# Patient Record
Sex: Female | Born: 1960 | Race: Black or African American | Hispanic: No | Marital: Single | Smoking: Never smoker
Health system: Southern US, Community
[De-identification: ages and names within clinical notes are randomized; demographics above are authoritative.]

## PROBLEM LIST (undated history)

## (undated) DIAGNOSIS — M5416 Radiculopathy, lumbar region: Secondary | ICD-10-CM

## (undated) DIAGNOSIS — F0781 Postconcussional syndrome: Secondary | ICD-10-CM

## (undated) DIAGNOSIS — R609 Edema, unspecified: Secondary | ICD-10-CM

## (undated) DIAGNOSIS — G40909 Epilepsy, unspecified, not intractable, without status epilepticus: Secondary | ICD-10-CM

## (undated) DIAGNOSIS — G43909 Migraine, unspecified, not intractable, without status migrainosus: Secondary | ICD-10-CM

## (undated) DIAGNOSIS — R7989 Other specified abnormal findings of blood chemistry: Secondary | ICD-10-CM

## (undated) DIAGNOSIS — M549 Dorsalgia, unspecified: Secondary | ICD-10-CM

## (undated) DIAGNOSIS — R569 Unspecified convulsions: Secondary | ICD-10-CM

## (undated) DIAGNOSIS — E538 Deficiency of other specified B group vitamins: Secondary | ICD-10-CM

## (undated) DIAGNOSIS — M542 Cervicalgia: Secondary | ICD-10-CM

## (undated) DIAGNOSIS — H9319 Tinnitus, unspecified ear: Secondary | ICD-10-CM

## (undated) DIAGNOSIS — R251 Tremor, unspecified: Secondary | ICD-10-CM

## (undated) DIAGNOSIS — G8929 Other chronic pain: Secondary | ICD-10-CM

## (undated) DIAGNOSIS — I10 Essential (primary) hypertension: Secondary | ICD-10-CM

## (undated) DIAGNOSIS — M79606 Pain in leg, unspecified: Secondary | ICD-10-CM

## (undated) HISTORY — DX: Migraine, unspecified, not intractable, without status migrainosus: G43.909

## (undated) HISTORY — DX: Essential (primary) hypertension: I10

## (undated) HISTORY — DX: Radiculopathy, lumbar region: M54.16

## (undated) HISTORY — PX: SHOULDER SURGERY: SHX246

## (undated) HISTORY — PX: NECK SURGERY: SHX720

## (undated) HISTORY — PX: CARPAL TUNNEL RELEASE: SHX101

## (undated) HISTORY — DX: Epilepsy, unspecified, not intractable, without status epilepticus: G40.909

## (undated) HISTORY — DX: Cervicalgia: M54.2

## (undated) HISTORY — DX: Tremor, unspecified: R25.1

## (undated) HISTORY — PX: BACK SURGERY: SHX140

## (undated) HISTORY — PX: LAPAROSCOPIC HYSTERECTOMY: SHX1926

## (undated) HISTORY — DX: Dorsalgia, unspecified: M54.9

## (undated) HISTORY — DX: Deficiency of other specified B group vitamins: E53.8

## (undated) HISTORY — PX: OTHER SURGICAL HISTORY: SHX169

## (undated) HISTORY — DX: Other chronic pain: G89.29

## (undated) HISTORY — DX: Postconcussional syndrome: F07.81

## (undated) HISTORY — DX: Unspecified convulsions: R56.9

## (undated) HISTORY — DX: Edema, unspecified: R60.9

## (undated) HISTORY — DX: Pain in leg, unspecified: M79.606

## (undated) HISTORY — PX: CHOLECYSTECTOMY: SHX55

## (undated) HISTORY — DX: Other specified abnormal findings of blood chemistry: R79.89

## (undated) HISTORY — DX: Tinnitus, unspecified ear: H93.19

---

## 2019-08-28 ENCOUNTER — Encounter: Payer: Self-pay | Admitting: Internal Medicine

## 2019-10-29 ENCOUNTER — Telehealth: Payer: Self-pay | Admitting: *Deleted

## 2019-10-29 ENCOUNTER — Encounter: Payer: Self-pay | Admitting: Nurse Practitioner

## 2019-10-29 ENCOUNTER — Ambulatory Visit (INDEPENDENT_AMBULATORY_CARE_PROVIDER_SITE_OTHER): Payer: Medicare PPO | Admitting: Nurse Practitioner

## 2019-10-29 ENCOUNTER — Other Ambulatory Visit: Payer: Self-pay

## 2019-10-29 ENCOUNTER — Telehealth: Payer: Self-pay | Admitting: Internal Medicine

## 2019-10-29 DIAGNOSIS — R1310 Dysphagia, unspecified: Secondary | ICD-10-CM | POA: Diagnosis not present

## 2019-10-29 DIAGNOSIS — K219 Gastro-esophageal reflux disease without esophagitis: Secondary | ICD-10-CM

## 2019-10-29 DIAGNOSIS — R109 Unspecified abdominal pain: Secondary | ICD-10-CM

## 2019-10-29 DIAGNOSIS — R197 Diarrhea, unspecified: Secondary | ICD-10-CM | POA: Insufficient documentation

## 2019-10-29 DIAGNOSIS — R1319 Other dysphagia: Secondary | ICD-10-CM

## 2019-10-29 DIAGNOSIS — Z800 Family history of malignant neoplasm of digestive organs: Secondary | ICD-10-CM | POA: Insufficient documentation

## 2019-10-29 MED ORDER — PANTOPRAZOLE SODIUM 40 MG PO TBEC: 40.0000 mg | DELAYED_RELEASE_TABLET | Freq: Two times a day (BID) | ORAL | 3 refills | Status: DC

## 2019-10-29 MED ORDER — DICYCLOMINE HCL 10 MG PO CAPS: 10.0000 mg | ORAL_CAPSULE | Freq: Three times a day (TID) | ORAL | 3 refills | Status: DC | PRN

## 2019-10-29 NOTE — Patient Instructions (Signed)
Your health issues we discussed today were:   GERD (reflux/heartburn): 1. Because you are having worsening GERD symptoms, I feel this can be contributing to your nausea and vomiting 2. I am going to increase your Protonix to 40 mg twice daily 3. Take this pursing in the morning and 30 minutes for your last meal the day 4. Let us know if you have any worsening or severe symptoms  Diarrhea with abdominal pain: 1. I doubt you have an infection because of the amount of time you have had your diarrhea 2. Your symptoms are consistent with irritable bowel syndrome diarrhea type 3. I will send in Bentyl (dicyclomine) 10 mg.  Take this 3 times a day, as needed/if needed for abdominal pain and/or diarrhea 4. Call us if you have any worsening or severe symptoms  Dysphagia (swallowing difficulties): 1. As we discussed, GERD symptoms over a prolonged period of time can cause irritation of your esophagus, esophageal strictures or narrowings, and swallowing difficulties 2. We will plan for an upper endoscopy with possible dilation to further evaluate and treat your symptoms 3. Try to eat softer foods for now.  If food gets stuck and will not go forward or come back for more than 2 hours then proceed to the emergency room  Family history of colon cancer: 1. Because of your family history of colon cancer and that has been 5 to 6 years since your last colonoscopy, we will schedule you for an updated colonoscopy 2. We will also request your previous colonoscopy records from [place] 3. Further recommendations will follow your colonoscopy  Overall I recommend:  1. Continue your other current medications 2. Return for follow-up in 3 months 3. Call us if you have any questions or concerns  At [facility] Gastroenterology we value your feedback. You may receive a survey about your visit today. Please share your experience as we strive to create trusting relationships with our patients to provide genuine,  compassionate, quality care.  We appreciate your understanding and patience as we review any laboratory studies, imaging, and other diagnostic tests that are ordered as we care for you. Our office policy is 5 business days for review of these results, and any emergent or urgent results are addressed in a timely manner for your best interest. If you do not hear from our office in 1 week, please contact us.   We also encourage the use of MyChart, which contains your medical information for your review as well. If you are not enrolled in this feature, an access code is on this after visit summary for your convenience. Thank you for allowing [place] to be involved in your care.  It was great to see you today!  I hope you have a great rest of your summer!!

## 2019-10-29 NOTE — Progress Notes (Signed)
Primary Care Physician:  Lamont Snowball, MD Primary Gastroenterologist:  Dr. Abbey Chatters  Chief Complaint  Patient presents with  . Diarrhea    10 years  . Emesis    10 years     HPI:   Pam Mcneil is a 59 y.o. female who presents on referral for diarrhea and vomiting.  Reviewed information provided with referral including office visit dated 07/29/2019.  At that time patient noted continued intermittent diarrhea and nausea, history of polyps with prior colonoscopy.  Family history of colon cancer.  Noted left and right lower abdominal pain that is intermittent, she was trialed on Cipro.  Today she states she's doing ok overall. She has had diarrhea for about 10 years or more. Thinks it's due to her medications side effects. Typically has a bowel movement every other day to every 2 days; soft/loose (Bristol 5-6). Has never been normal. She has had a cholecystectomy over 7 years ago. She does have abdominal pain associated with loose stools, pain improves some with bowel movement. Feels better now on "the medication" (presume Cipro). Denies hematochezia. This past weekend she had abdominal swelling and bloating with right sided pain, was taking Tylenol (near maximum dose due to not having pain medications). Uses Naloxone but the medication has been on backorder. When she stopped taking the Tylenol and rested, her symptoms improved. Having significant back pain (chronic pain). Has upcoming spine studies. She also has nausea postprandial; has solid food dysphagia and regurgitation; occurs frequently. Has GERD symptoms even with Pantoprazole 40 mg daily. Denies fever, chills, unintentional weight loss. Denies URI or flu-like symptoms. Denies loss of sense of taste or smell. The patient has not received COVID-19 vaccination(s). They are not interested in vaccine scheduling information. Denies chest pain, dyspnea, dizziness, lightheadedness, syncope, near syncope. Denies any other upper or lower GI  symptoms.  Colon cancer in her brother and was previously having annual colonoscopy, then biannual, then every 5 years. She was due for updated TCS last year and would like to pursue scheduling.  Past Medical History:  Diagnosis Date  . B12 deficiency   . Back pain   . Chronic pain   . Edema   . Epilepsy ([place])   . Hypertension   . Leg pain   . Low vitamin D level   . Lumbar radiculopathy   . Migraine   . Neck pain   . Post concussion syndrome   . Seizures ([place])    Last seizues 04/2019; sees neurology  . Tinnitus   . Tremor     Past Surgical History:  Procedure Laterality Date  . BACK SURGERY    . CARPAL TUNNEL RELEASE    . CHOLECYSTECTOMY    . LAPAROSCOPIC HYSTERECTOMY    . NECK SURGERY    . rib removal    . SHOULDER SURGERY     open    Current Outpatient Medications  Medication Sig Dispense Refill  . Ascorbic Acid (VITAMIN C) 100 MG tablet Take 500 mg by mouth daily.    Marland Kitchen aspirin 81 MG EC tablet Take by mouth daily.    . Botulinum Toxin Type A (BOTOX) 200 units SOLR 200 Units. Every 12 weeks    . celecoxib (CELEBREX) 200 MG capsule 200 mg daily.    . Cholecalciferol (DIALYVITE VITAMIN D3 MAX) 1.25 MG (50000 UT) TABS Take by mouth once a week.    . ciprofloxacin (CIPRO) 500 MG tablet 500 mg 2 (two) times daily.    . cyclobenzaprine (  FLEXERIL) 10 MG tablet 10 mg 2 (two) times daily.    Marland Kitchen estradiol (ESTRACE) 0.5 MG tablet 0.5 mg daily.    . Evening Primrose Oil 1000 MG CAPS Take 1,000 mg by mouth 3 (three) times daily.    . furosemide (LASIX) 20 MG tablet 20 mg daily.    Marland Kitchen L-Lysine 1000 MG TABS Take 1,000 mg by mouth daily.    Marland Kitchen levETIRAcetam (KEPPRA) 500 MG tablet 500 mg 2 (two) times daily.    Marland Kitchen lisinopril (ZESTRIL) 20 MG tablet 20 mg daily.    Marland Kitchen loratadine (CLARITIN) 10 MG tablet 10 mg daily.    . Multiple Vitamins-Minerals (WOMENS PACK) MISC daily.    . nortriptyline (PAMELOR) 50 MG capsule 100 mg at bedtime.    . Omega-3 Fatty Acids (FISH OIL) 1000 MG CAPS Take  by mouth 3 (three) times daily.    . ondansetron (ZOFRAN) 4 MG tablet 4 mg every 8 (eight) hours as needed.    . pantoprazole (PROTONIX) 40 MG tablet 40 mg daily.    Marland Kitchen PARoxetine (PAXIL) 40 MG tablet 40 mg daily.    . pentazocine-naloxone (TALWIN NX) 50-0.5 MG tablet Take 1 tablet by mouth every 8 (eight) hours as needed.    . pregabalin (LYRICA) 100 MG capsule 100 mg 2 (two) times daily.    . Probiotic Product (PROBIOTIC PO) Take by mouth daily.    Marland Kitchen topiramate (TOPAMAX) 50 MG tablet 50 mg 2 (two) times daily.    . valACYclovir (VALTREX) 1000 MG tablet as needed.    . dicyclomine (BENTYL) 10 MG capsule Take 1 capsule (10 mg total) by mouth 3 (three) times daily as needed for spasms (or diarrhea). 30 capsule 3  . pantoprazole (PROTONIX) 40 MG tablet Take 1 tablet (40 mg total) by mouth 2 (two) times daily before a meal. 60 tablet 3   No current facility-administered medications for this visit.    Allergies as of 10/29/2019  . (No Known Allergies)    History reviewed. No pertinent family history.  Social History   Socioeconomic History  . Marital status: Single    Spouse name: Not on file  . Number of children: Not on file  . Years of education: Not on file  . Highest education level: Not on file  Occupational History  . Not on file  Tobacco Use  . Smoking status: Never Smoker  . Smokeless tobacco: Never Used  Substance and Sexual Activity  . Alcohol use: Not on file  . Drug use: Not on file  . Sexual activity: Not on file  Other Topics Concern  . Not on file  Social History Narrative  . Not on file   Social Determinants of Health   Financial Resource Strain:   . Difficulty of Paying Living Expenses: Not on file  Food Insecurity:   . Worried About Charity fundraiser in the Last Year: Not on file  . Ran Out of Food in the Last Year: Not on file  Transportation Needs:   . Lack of Transportation (Medical): Not on file  . Lack of Transportation (Non-Medical): Not on  file  Physical Activity:   . Days of Exercise per Week: Not on file  . Minutes of Exercise per Session: Not on file  Stress:   . Feeling of Stress : Not on file  Social Connections:   . Frequency of Communication with Friends and Family: Not on file  . Frequency of Social Gatherings with Friends and Family: Not  on file  . Attends Religious Services: Not on file  . Active Member of Clubs or Organizations: Not on file  . Attends Archivist Meetings: Not on file  . Marital Status: Not on file  Intimate Partner Violence:   . Fear of Current or Ex-Partner: Not on file  . Emotionally Abused: Not on file  . Physically Abused: Not on file  . Sexually Abused: Not on file    Subjective: Review of Systems  Constitutional: Negative for chills, fever, malaise/fatigue and weight loss.  HENT: Negative for congestion and sore throat.   Respiratory: Negative for cough and shortness of breath.   Cardiovascular: Negative for chest pain and palpitations.  Gastrointestinal: Positive for abdominal pain, diarrhea, nausea and vomiting. Negative for blood in stool and melena.  Musculoskeletal: Negative for joint pain and myalgias.  Skin: Negative for rash.  Neurological: Negative for dizziness and weakness.  Endo/Heme/Allergies: Does not bruise/bleed easily.  Psychiatric/Behavioral: Negative for depression. The patient is not nervous/anxious.   All other systems reviewed and are negative.      Objective: BP 108/67   Pulse 91   Temp 97.9 F (36.6 C) (Temporal)   Ht 6' (1.829 m)   Wt 277 lb (125.6 kg)   BMI 37.57 kg/m  Physical Exam Vitals and nursing note reviewed.  Constitutional:      General: She is not in acute distress.    Appearance: Normal appearance. She is well-developed. She is obese. She is not ill-appearing, toxic-appearing or diaphoretic.  HENT:     Head: Normocephalic and atraumatic.     Nose: No congestion or rhinorrhea.  Eyes:     General: No scleral  icterus. Cardiovascular:     Rate and Rhythm: Normal rate and regular rhythm.     Heart sounds: Normal heart sounds.  Pulmonary:     Effort: Pulmonary effort is normal. No respiratory distress.     Breath sounds: Normal breath sounds.  Abdominal:     General: Bowel sounds are normal.     Palpations: Abdomen is soft. There is no hepatomegaly, splenomegaly or mass.     Tenderness: There is no abdominal tenderness. There is no guarding or rebound.     Hernia: No hernia is present.  Skin:    General: Skin is warm and dry.     Coloration: Skin is not jaundiced.     Findings: No rash.  Neurological:     General: No focal deficit present.     Mental Status: She is alert and oriented to person, place, and time.  Psychiatric:        Attention and Perception: Attention normal.        Mood and Affect: Mood normal.        Speech: Speech normal.        Behavior: Behavior normal.        Thought Content: Thought content normal.        Cognition and Memory: Cognition and memory normal.      Assessment:  Very pleasant 59 year old female who presents for evaluation of nausea and vomiting as well as diarrhea.  She states she has had the symptoms for over 10 years.  She feels like her diarrhea is due to medication effect.  She describes abdominal pain associated with diarrhea which often improves with a bowel movement.  She is not having frequent daily stools but rather has a stool about every 2 to 3 days, which is loose or soft consistently Bristol 5-6.  She has not tried any medications for this.  She also describes GERD and dysphagia symptoms.  GERD with nausea and vomiting: The patient does have persistent GERD despite Protonix once daily.  This could be contributing to her nausea and vomiting as well as her dysphagia symptoms.  At this point she is on Protonix once daily and I will increase this to twice daily.  We can consider change of agent pending EGD results.  Other possible explanations for  her nausea could be gastroparesis, gastritis, although she does not specifically have a history of diabetes.  Further recommendations to follow-up  Dysphagia: The patient notes solid food dysphagia as well as issues with liquids.  She does have longstanding, persistently symptomatic GERD which could be contributing.  Cannot rule out stricture, web, ring.  At this point given her persistent GERD symptoms and dysphagia symptoms we will plan for an upper endoscopy with possible dilation.  We discussed this and she is agreeable to proceed.  Further recommendations will follow the results of her procedure  Chronic diarrhea and family history of colorectal cancer in her brother: Her last colonoscopy was in [place] and we will request these records.  She states that her brother had colon cancer and she had undergo yearly colonoscopies for a few years at which point they back off to every other year for a few times and then every 5 years.  Her last colonoscopy has not been 5 to 6 years ago and she has since moved from [place] back down to [place].  She would like to proceed with scheduling a colonoscopy at this time as well.  We will proceed with scheduling and further recommendations will follow  Proceed with TCS and EGD +/- dilation with Dr. Abbey Chatters on propofol/MAC in near future: the risks, benefits, and alternatives have been discussed with the patient in detail. The patient states understanding and desires to proceed.  ASA III (BMI, history of seizures)   Plan: 1. Increase Protonix to 40 mg twice daily 2. Start Bentyl 3 times daily as needed for diarrhea and/or abdominal cramping 3. Request previous colonoscopy records from [facility] and [place], [place] (Dr. Norvel Richards; [phone]) 4. Proceed with colonoscopy and upper endoscopy as outlined above 5. Return for follow-up in 3 months 6. Call for worsening symptoms    Thank you for allowing [place] to participate in the care of  Pam Mcneil, [place], AGNP-C Adult & Gerontological Nurse Practitioner [facility]   10/29/2019 4:22 PM   Disclaimer: This note was dictated with voice recognition software. Similar sounding words can inadvertently be transcribed and may not be corrected upon review.

## 2019-10-29 NOTE — Telephone Encounter (Signed)
LMOVM to schedule TCS/EGD/DIL with Dr. Carver, ASA 3 

## 2019-10-29 NOTE — Telephone Encounter (Signed)
noted 

## 2019-10-29 NOTE — Telephone Encounter (Signed)
Returning call. [phone]

## 2019-10-30 NOTE — Telephone Encounter (Signed)
LMOVM. Letter mailed  

## 2019-10-30 NOTE — Telephone Encounter (Addendum)
Patient returned call. She has been scheduled for TCS/EGD/DIL with Dr. Marletta Lor, asa 3 on 11/9 at 12:30pm. Patient aware will mail instructions. Confirmed address.   PA for TCS/EGD/DIL approved via [employer]. Auth# [id number] dates 12/22/19-01/21/2020

## 2019-11-02 ENCOUNTER — Encounter: Payer: Self-pay | Admitting: *Deleted

## 2019-12-18 NOTE — Patient Instructions (Addendum)
Pam Mcneil  12/18/2019     @PREFPERIOPPHARMACY @   Your procedure is scheduled on  12/22/2019.  Report to 13/10/2019 at  1045  A.M.  Call this number if you have problems the morning of surgery:  [phone]   Remember:  Follow the diet and prep instructions given to you by the office.                        Take these medicines the morning of surgery with A SIP OF WATER  Celebrex, flexeril(if needed), voltaren, bentyl, benadryl, keppra, zofran(if needed), protonix, paxil, lyrica, topamax.    Do not wear jewelry, make-up or nail polish.  Do not wear lotions, powders, or perfumes. Please wear deodorant and brush your teeth.  Do not shave 48 hours prior to surgery.  Men may shave face and neck.  Do not bring valuables to the hospital.  [facility] is not responsible for any belongings or valuables.  Contacts, dentures or bridgework may not be worn into surgery.  Leave your suitcase in the car.  After surgery it may be brought to your room.  For patients admitted to the hospital, discharge time will be determined by your treatment team.  Patients discharged the day of surgery will not be allowed to drive home.   Name and phone number of your driver:   family Special instructions:  DO NOT smoke the morning of your procedure.  Please read over the following fact sheets that you were given. Anesthesia Post-op Instructions and Care and Recovery After Surgery       Upper Endoscopy, Adult, Care After This sheet gives you information about how to care for yourself after your procedure. Your health care provider may also give you more specific instructions. If you have problems or questions, contact your health care provider. What can I expect after the procedure? After the procedure, it is common to have:  A sore throat.  Mild stomach pain or discomfort.  Bloating.  Nausea. Follow these instructions at home:   Follow instructions from your health care  provider about what to eat or drink after your procedure.  Return to your normal activities as told by your health care provider. Ask your health care provider what activities are safe for you.  Take over-the-counter and prescription medicines only as told by your health care provider.  Do not drive for 24 hours if you were given a sedative during your procedure.  Keep all follow-up visits as told by your health care provider. This is important. Contact a health care provider if you have:  A sore throat that lasts longer than one day.  Trouble swallowing. Get help right away if:  You vomit blood or your vomit looks like coffee grounds.  You have: ? A fever. ? Bloody, black, or tarry stools. ? A severe sore throat or you cannot swallow. ? Difficulty breathing. ? Severe pain in your chest or abdomen. Summary  After the procedure, it is common to have a sore throat, mild stomach discomfort, bloating, and nausea.  Do not drive for 24 hours if you were given a sedative during the procedure.  Follow instructions from your health care provider about what to eat or drink after your procedure.  Return to your normal activities as told by your health care provider. This information is not intended to replace advice given to you by your health care provider. Make  sure you discuss any questions you have with your health care provider. Document Revised: 07/23/2017 Document Reviewed: 07/01/2017 Elsevier Patient Education  [place].  Esophageal Dilatation Esophageal dilatation, also called esophageal dilation, is a procedure to widen or open (dilate) a blocked or narrowed part of the esophagus. The esophagus is the part of the body that moves food and liquid from the mouth to the stomach. You may need this procedure if:  You have a buildup of scar tissue in your esophagus that makes it difficult, painful, or impossible to swallow. This can be caused by gastroesophageal reflux  disease (GERD).  You have cancer of the esophagus.  There is a problem with how food moves through your esophagus. In some cases, you may need this procedure repeated at a later time to dilate the esophagus gradually. Tell a health care provider about:  Any allergies you have.  All medicines you are taking, including vitamins, herbs, eye drops, creams, and over-the-counter medicines.  Any problems you or family members have had with anesthetic medicines.  Any blood disorders you have.  Any surgeries you have had.  Any medical conditions you have.  Any antibiotic medicines you are required to take before dental procedures.  Whether you are pregnant or may be pregnant. What are the risks? Generally, this is a safe procedure. However, problems may occur, including:  Bleeding due to a tear in the lining of the esophagus.  A hole (perforation) in the esophagus. What happens before the procedure?  Follow instructions from your health care provider about eating or drinking restrictions.  Ask your health care provider about changing or stopping your regular medicines. This is especially important if you are taking diabetes medicines or blood thinners.  Plan to have someone take you home from the hospital or clinic.  Plan to have a responsible adult care for you for at least 24 hours after you leave the hospital or clinic. This is important. What happens during the procedure?  You may be given a medicine to help you relax (sedative).  A numbing medicine may be sprayed into the back of your throat, or you may gargle the medicine.  Your health care provider may perform the dilatation using various surgical instruments, such as: ? Simple dilators. This instrument is carefully placed in the esophagus to stretch it. ? Guided wire bougies. This involves using an endoscope to insert a wire into the esophagus. A dilator is passed over this wire to enlarge the esophagus. Then the wire is  removed. ? Balloon dilators. An endoscope with a small balloon at the end is inserted into the esophagus. The balloon is inflated to stretch the esophagus and open it up. The procedure may vary among health care providers and hospitals. What happens after the procedure?  Your blood pressure, heart rate, breathing rate, and blood oxygen level will be monitored until the medicines you were given have worn off.  Your throat may feel slightly sore and numb. This will improve slowly over time.  You will not be allowed to eat or drink until your throat is no longer numb.  When you are able to drink, urinate, and sit on the edge of the bed without nausea or dizziness, you may be able to return home. Follow these instructions at home:  Take over-the-counter and prescription medicines only as told by your health care provider.  Do not drive for 24 hours if you were given a sedative during your procedure.  You should have a  responsible adult with you for 24 hours after the procedure.  Follow instructions from your health care provider about any eating or drinking restrictions.  Do not use any products that contain nicotine or tobacco, such as cigarettes and e-cigarettes. If you need help quitting, ask your health care provider.  Keep all follow-up visits as told by your health care provider. This is important. Get help right away if you:  Have a fever.  Have chest pain.  Have pain that is not relieved by medication.  Have trouble breathing.  Have trouble swallowing.  Vomit blood. Summary  Esophageal dilatation, also called esophageal dilation, is a procedure to widen or open (dilate) a blocked or narrowed part of the esophagus.  Plan to have someone take you home from the hospital or clinic.  For this procedure, a numbing medicine may be sprayed into the back of your throat, or you may gargle the medicine.  Do not drive for 24 hours if you were given a sedative during your  procedure. This information is not intended to replace advice given to you by your health care provider. Make sure you discuss any questions you have with your health care provider. Document Revised: 11/26/2018 Document Reviewed: 12/04/2016 Elsevier Patient Education  [place].  Colonoscopy, Adult, Care After This sheet gives you information about how to care for yourself after your procedure. Your health care provider may also give you more specific instructions. If you have problems or questions, contact your health care provider. What can I expect after the procedure? After the procedure, it is common to have:  A small amount of blood in your stool for 24 hours after the procedure.  Some gas.  Mild cramping or bloating of your abdomen. Follow these instructions at home: Eating and drinking   Drink enough fluid to keep your urine pale yellow.  Follow instructions from your health care provider about eating or drinking restrictions.  Resume your normal diet as instructed by your health care provider. Avoid heavy or fried foods that are hard to digest. Activity  Rest as told by your health care provider.  Avoid sitting for a long time without moving. Get up to take short walks every 1-2 hours. This is important to improve blood flow and breathing. Ask for help if you feel weak or unsteady.  Return to your normal activities as told by your health care provider. Ask your health care provider what activities are safe for you. Managing cramping and bloating   Try walking around when you have cramps or feel bloated.  Apply heat to your abdomen as told by your health care provider. Use the heat source that your health care provider recommends, such as a moist heat pack or a heating pad. ? Place a towel between your skin and the heat source. ? Leave the heat on for 20-30 minutes. ? Remove the heat if your skin turns bright red. This is especially important if you are unable  to feel pain, heat, or cold. You may have a greater risk of getting burned. General instructions  For the first 24 hours after the procedure: ? Do not drive or use machinery. ? Do not sign important documents. ? Do not drink alcohol. ? Do your regular daily activities at a slower pace than normal. ? Eat soft foods that are easy to digest.  Take over-the-counter and prescription medicines only as told by your health care provider.  Keep all follow-up visits as told by your health care  provider. This is important. Contact a health care provider if:  You have blood in your stool 2-3 days after the procedure. Get help right away if you have:  More than a small spotting of blood in your stool.  Large blood clots in your stool.  Swelling of your abdomen.  Nausea or vomiting.  A fever.  Increasing pain in your abdomen that is not relieved with medicine. Summary  After the procedure, it is common to have a small amount of blood in your stool. You may also have mild cramping and bloating of your abdomen.  For the first 24 hours after the procedure, do not drive or use machinery, sign important documents, or drink alcohol.  Get help right away if you have a lot of blood in your stool, nausea or vomiting, a fever, or increased pain in your abdomen. This information is not intended to replace advice given to you by your health care provider. Make sure you discuss any questions you have with your health care provider. Document Revised: 08/25/2018 Document Reviewed: 08/25/2018 Elsevier Patient Education  [place] After These instructions provide you with information about caring for yourself after your procedure. Your health care provider may also give you more specific instructions. Your treatment has been planned according to current medical practices, but problems sometimes occur. Call your health care provider if you have any problems or  questions after your procedure. What can I expect after the procedure? After your procedure, you may:  Feel sleepy for several hours.  Feel clumsy and have poor balance for several hours.  Feel forgetful about what happened after the procedure.  Have poor judgment for several hours.  Feel nauseous or vomit.  Have a sore throat if you had a breathing tube during the procedure. Follow these instructions at home: For at least 24 hours after the procedure:      Have a responsible adult stay with you. It is important to have someone help care for you until you are awake and alert.  Rest as needed.  Do not: ? Participate in activities in which you could fall or become injured. ? Drive. ? Use heavy machinery. ? Drink alcohol. ? Take sleeping pills or medicines that cause drowsiness. ? Make important decisions or sign legal documents. ? Take care of children on your own. Eating and drinking  Follow the diet that is recommended by your health care provider.  If you vomit, drink water, juice, or soup when you can drink without vomiting.  Make sure you have little or no nausea before eating solid foods. General instructions  Take over-the-counter and prescription medicines only as told by your health care provider.  If you have sleep apnea, surgery and certain medicines can increase your risk for breathing problems. Follow instructions from your health care provider about wearing your sleep device: ? Anytime you are sleeping, including during daytime naps. ? While taking prescription pain medicines, sleeping medicines, or medicines that make you drowsy.  If you smoke, do not smoke without supervision.  Keep all follow-up visits as told by your health care provider. This is important. Contact a health care provider if:  You keep feeling nauseous or you keep vomiting.  You feel light-headed.  You develop a rash.  You have a fever. Get help right away if:  You have  trouble breathing. Summary  For several hours after your procedure, you may feel sleepy and have poor judgment.  Have a responsible  adult stay with you for at least 24 hours or until you are awake and alert. This information is not intended to replace advice given to you by your health care provider. Make sure you discuss any questions you have with your health care provider. Document Revised: 04/29/2017 Document Reviewed: 05/22/2015 Elsevier Patient Education  2020 [employer]. `

## 2019-12-21 ENCOUNTER — Encounter (HOSPITAL_COMMUNITY)
Admission: RE | Admit: 2019-12-21 | Discharge: 2019-12-21 | Disposition: A | Discharge status: Home / Self Care | Payer: Medicare PPO | Source: Ambulatory Visit | Attending: Internal Medicine | Admitting: Internal Medicine

## 2019-12-21 ENCOUNTER — Other Ambulatory Visit (HOSPITAL_COMMUNITY)
Admission: RE | Admit: 2019-12-21 | Discharge: 2019-12-21 | Disposition: A | Discharge status: Home / Self Care | Payer: Medicare PPO | Source: Ambulatory Visit | Attending: Internal Medicine | Admitting: Internal Medicine

## 2019-12-21 ENCOUNTER — Other Ambulatory Visit: Payer: Self-pay

## 2019-12-21 ENCOUNTER — Encounter (HOSPITAL_COMMUNITY): Payer: Self-pay

## 2019-12-21 DIAGNOSIS — Z01818 Encounter for other preprocedural examination: Secondary | ICD-10-CM | POA: Diagnosis present

## 2019-12-21 DIAGNOSIS — Z20822 Contact with and (suspected) exposure to covid-19: Secondary | ICD-10-CM | POA: Insufficient documentation

## 2019-12-21 LAB — BASIC METABOLIC PANEL
Anion gap: 8 (ref 5–15)
BUN: 23 mg/dL — ABNORMAL HIGH (ref 6–20)
CO2: 26 mmol/L (ref 22–32)
Calcium: 9 mg/dL (ref 8.9–10.3)
Chloride: 103 mmol/L (ref 98–111)
Creatinine, Ser: 0.99 mg/dL (ref 0.44–1.00)
GFR, Estimated: >60 mL/min (ref >60)
Glucose, Bld: 87 mg/dL (ref 70–99)
Potassium: 4.2 mmol/L (ref 3.5–5.1)
Sodium: 137 mmol/L (ref 135–145)

## 2019-12-21 LAB — SARS CORONAVIRUS 2 (TAT 6-24 HRS): SARS Coronavirus 2: NEGATIVE

## 2019-12-22 ENCOUNTER — Ambulatory Visit (HOSPITAL_COMMUNITY): Payer: Medicare PPO | Admitting: Anesthesiology

## 2019-12-22 ENCOUNTER — Ambulatory Visit (HOSPITAL_COMMUNITY)
Admission: RE | Admit: 2019-12-22 | Discharge: 2019-12-22 | Disposition: A | Discharge status: Home / Self Care | Payer: Medicare PPO | Attending: Internal Medicine | Admitting: Internal Medicine

## 2019-12-22 ENCOUNTER — Encounter (HOSPITAL_COMMUNITY): Payer: Self-pay | Admitting: *Deleted

## 2019-12-22 ENCOUNTER — Other Ambulatory Visit: Payer: Self-pay

## 2019-12-22 ENCOUNTER — Encounter (HOSPITAL_COMMUNITY)
Admission: RE | Disposition: A | Discharge status: Home / Self Care | Payer: Self-pay | Source: Home / Self Care | Attending: Internal Medicine

## 2019-12-22 DIAGNOSIS — K6389 Other specified diseases of intestine: Secondary | ICD-10-CM | POA: Insufficient documentation

## 2019-12-22 DIAGNOSIS — K5730 Diverticulosis of large intestine without perforation or abscess without bleeding: Secondary | ICD-10-CM | POA: Diagnosis not present

## 2019-12-22 DIAGNOSIS — K529 Noninfective gastroenteritis and colitis, unspecified: Secondary | ICD-10-CM | POA: Insufficient documentation

## 2019-12-22 DIAGNOSIS — K222 Esophageal obstruction: Secondary | ICD-10-CM | POA: Insufficient documentation

## 2019-12-22 DIAGNOSIS — K648 Other hemorrhoids: Secondary | ICD-10-CM | POA: Diagnosis not present

## 2019-12-22 DIAGNOSIS — R1310 Dysphagia, unspecified: Secondary | ICD-10-CM | POA: Insufficient documentation

## 2019-12-22 DIAGNOSIS — K219 Gastro-esophageal reflux disease without esophagitis: Secondary | ICD-10-CM | POA: Insufficient documentation

## 2019-12-22 DIAGNOSIS — Z9884 Bariatric surgery status: Secondary | ICD-10-CM | POA: Insufficient documentation

## 2019-12-22 HISTORY — PX: ESOPHAGOGASTRODUODENOSCOPY (EGD) WITH PROPOFOL: SHX5813

## 2019-12-22 HISTORY — PX: COLONOSCOPY WITH PROPOFOL: SHX5780

## 2019-12-22 HISTORY — PX: BIOPSY: SHX5522

## 2019-12-22 HISTORY — PX: BALLOON DILATION: SHX5330

## 2019-12-22 SURGERY — COLONOSCOPY WITH PROPOFOL
Anesthesia: General

## 2019-12-22 MED ORDER — LIDOCAINE VISCOUS HCL 2 % MT SOLN
15.0000 mL | Freq: Once | OROMUCOSAL | Status: AC
  Administered 2019-12-22: 15 mL via OROMUCOSAL

## 2019-12-22 MED ORDER — PROPOFOL 10 MG/ML IV BOLUS
INTRAVENOUS | Status: DC | PRN
  Administered 2019-12-22: 100 mg via INTRAVENOUS

## 2019-12-22 MED ORDER — LACTATED RINGERS IV SOLN: INTRAVENOUS | Status: DC | PRN

## 2019-12-22 MED ORDER — OMEPRAZOLE 40 MG PO CPDR: 40.0000 mg | DELAYED_RELEASE_CAPSULE | Freq: Every day | ORAL | 5 refills | Status: DC

## 2019-12-22 MED ORDER — LACTATED RINGERS IV SOLN: Freq: Once | INTRAVENOUS | Status: AC

## 2019-12-22 MED ORDER — CHLORHEXIDINE GLUCONATE CLOTH 2 % EX PADS: 6.0000 | MEDICATED_PAD | Freq: Once | CUTANEOUS | Status: DC

## 2019-12-22 MED ORDER — LIDOCAINE HCL (CARDIAC) PF 100 MG/5ML IV SOSY
PREFILLED_SYRINGE | INTRAVENOUS | Status: DC | PRN
  Administered 2019-12-22: 100 mg via INTRAVENOUS

## 2019-12-22 MED ORDER — PROPOFOL 500 MG/50ML IV EMUL
INTRAVENOUS | Status: DC | PRN
  Administered 2019-12-22: 125 ug/kg/min via INTRAVENOUS
  Administered 2019-12-22: 150 ug/kg/min via INTRAVENOUS
  Administered 2019-12-22: 125 ug/kg/min via INTRAVENOUS

## 2019-12-22 MED ORDER — GLYCOPYRROLATE 0.2 MG/ML IJ SOLN
0.2000 mg | Freq: Once | INTRAMUSCULAR | Status: AC
  Administered 2019-12-22: 0.2 mg via INTRAVENOUS

## 2019-12-22 MED ORDER — LIDOCAINE VISCOUS HCL 2 % MT SOLN
OROMUCOSAL | Status: AC
  Filled 2019-12-22: qty 15

## 2019-12-22 MED ORDER — GLYCOPYRROLATE 0.2 MG/ML IJ SOLN
INTRAMUSCULAR | Status: AC
  Filled 2019-12-22: qty 1

## 2019-12-22 NOTE — Discharge Instructions (Addendum)
EGD Discharge instructions Please read the instructions outlined below and refer to this sheet in the next few weeks. These discharge instructions provide you with general information on caring for yourself after you leave the hospital. Your doctor may also give you specific instructions. While your treatment has been planned according to the most current medical practices available, unavoidable complications occasionally occur. If you have any problems or questions after discharge, please call your doctor. ACTIVITY  You may resume your regular activity but move at a slower pace for the next 24 hours.   Take frequent rest periods for the next 24 hours.   Walking will help expel (get rid of) the air and reduce the bloated feeling in your abdomen.   No driving for 24 hours (because of the anesthesia (medicine) used during the test).   You may shower.   Do not sign any important legal documents or operate any machinery for 24 hours (because of the anesthesia used during the test).  NUTRITION  Drink plenty of fluids.   You may resume your normal diet.   Begin with a light meal and progress to your normal diet.   Avoid alcoholic beverages for 24 hours or as instructed by your caregiver.  MEDICATIONS  You may resume your normal medications unless your caregiver tells you otherwise.  WHAT YOU CAN EXPECT TODAY  You may experience abdominal discomfort such as a feeling of fullness or "gas" pains.  FOLLOW-UP  Your doctor will discuss the results of your test with you.  SEEK IMMEDIATE MEDICAL ATTENTION IF ANY OF THE FOLLOWING OCCUR:  Excessive nausea (feeling sick to your stomach) and/or vomiting.   Severe abdominal pain and distention (swelling).   Trouble swallowing.   Temperature over 101 F (37.8 C).   Rectal bleeding or vomiting of blood.     Colonoscopy Discharge Instructions  Read the instructions outlined below and refer to this sheet in the next few weeks. These  discharge instructions provide you with general information on caring for yourself after you leave the hospital. Your doctor may also give you specific instructions. While your treatment has been planned according to the most current medical practices available, unavoidable complications occasionally occur.   ACTIVITY  You may resume your regular activity, but move at a slower pace for the next 24 hours.   Take frequent rest periods for the next 24 hours.   Walking will help get rid of the air and reduce the bloated feeling in your belly (abdomen).   No driving for 24 hours (because of the medicine (anesthesia) used during the test).    Do not sign any important legal documents or operate any machinery for 24 hours (because of the anesthesia used during the test).  NUTRITION  Drink plenty of fluids.   You may resume your normal diet as instructed by your doctor.   Begin with a light meal and progress to your normal diet. Heavy or fried foods are harder to digest and may make you feel sick to your stomach (nauseated).   Avoid alcoholic beverages for 24 hours or as instructed.  MEDICATIONS  You may resume your normal medications unless your doctor tells you otherwise.  WHAT YOU CAN EXPECT TODAY  Some feelings of bloating in the abdomen.   Passage of more gas than usual.   Spotting of blood in your stool or on the toilet paper.  IF YOU HAD POLYPS REMOVED DURING THE COLONOSCOPY:  No aspirin products for 7 days or as instructed.  No alcohol for 7 days or as instructed.   Eat a soft diet for the next 24 hours.  FINDING OUT THE RESULTS OF YOUR TEST Not all test results are available during your visit. If your test results are not back during the visit, make an appointment with your caregiver to find out the results. Do not assume everything is normal if you have not heard from your caregiver or the medical facility. It is important for you to follow up on all of your test results.   SEEK IMMEDIATE MEDICAL ATTENTION IF:  You have more than a spotting of blood in your stool.   Your belly is swollen (abdominal distention).   You are nauseated or vomiting.   You have a temperature over 101.   You have abdominal pain or discomfort that is severe or gets worse throughout the day.   Your EGD showed a mild amount inflammation at the anastomosis of your small bowel and gastric pouch.  I biopsied your gastric pouch to rule out infection with H. pylori.  I want to switch your PPI to omeprazole 40 mg daily.  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  Await pathology results my office will contact you.  You also had an esophageal stricture which I dilated today with a balloon.  Hopefully this helps with your swallowing.  Your colonoscopy was relatively unremarkable.  I did not find any polyps or evidence of colon cancer.  I did take biopsies to rule out a condition called microscopic colitis that can cause chronic diarrhea.  We should have these results back by the end of the week.  I recommend we repeat colonoscopy in 5 years due to family history of colon cancer.  Follow-up with [facility] in 3 months or as previously scheduled. I hope you have a great rest of your week!  Hennie Duos. Marletta Lor, D.O. Gastroenterology and Hepatology [facility]

## 2019-12-22 NOTE — Anesthesia Preprocedure Evaluation (Signed)
Anesthesia Evaluation  Patient identified by MRN, date of birth, ID band Patient awake    Reviewed: Allergy & Precautions, NPO status , Patient's Chart, lab work & pertinent test results  History of Anesthesia Complications Negative for: history of anesthetic complications  Airway Mallampati: II  TM Distance: >3 FB Neck ROM: Full    Dental  (+) Missing, Chipped, Dental Advisory Given, Poor Dentition   Pulmonary    Pulmonary exam normal breath sounds clear to auscultation       Cardiovascular Exercise Tolerance: Good hypertension, Normal cardiovascular exam Rhythm:Regular Rate:Normal     Neuro/Psych  Headaches, Seizures -, Well Controlled,  PSYCHIATRIC DISORDERS Dementia  Neuromuscular disease    GI/Hepatic GERD  Medicated,  Endo/Other  negative endocrine ROS  Renal/GU negative Renal ROS     Musculoskeletal  (+) Arthritis  (back pain, neck pain),   Abdominal   Peds  Hematology negative hematology ROS (+)   Anesthesia Other Findings   Reproductive/Obstetrics                            Anesthesia Physical Anesthesia Plan  ASA: III  Anesthesia Plan: General   Post-op Pain Management:    Induction: Intravenous  PONV Risk Score and Plan: TIVA  Airway Management Planned: Nasal Cannula and Natural Airway  Additional Equipment:   Intra-op Plan:   Post-operative Plan:   Informed Consent: I have reviewed the patients History and Physical, chart, labs and discussed the procedure including the risks, benefits and alternatives for the proposed anesthesia with the patient or authorized representative who has indicated his/her understanding and acceptance.     Dental advisory given  Plan Discussed with: CRNA and Surgeon  Anesthesia Plan Comments:         Anesthesia Quick Evaluation

## 2019-12-22 NOTE — Op Note (Signed)
[facility] Patient Name: Pam Mcneil Procedure Date: 12/22/2019 12:45 PM MRN: [id number] Date of Birth: 12/14/1960 Attending MD: Elon Alas. Abbey Chatters DO CSN: [id number] Age: 59 Admit Type: Outpatient Procedure:                Colonoscopy Indications:              Chronic diarrhea Providers:                Elon Alas. Marvina Danner, DO, Otis Peak B. Sharon Seller, RN,                            Randa Spike, Technician Referring MD:              Medicines:                See the Anesthesia note for documentation of the                            administered medications Complications:            No immediate complications. Estimated Blood Loss:     Estimated blood loss was minimal. Procedure:                Pre-Anesthesia Assessment:                           - The anesthesia plan was to use monitored                            anesthesia care (MAC).                           After obtaining informed consent, the colonoscope                            was passed under direct vision. Throughout the                            procedure, the patient's blood pressure, pulse, and                            oxygen saturations were monitored continuously. The                            [id number]-[id number] ([id number]) scope was introduced through                            the anus and advanced to the the cecum, identified                            by appendiceal orifice and ileocecal valve. The                            colonoscopy was performed without difficulty. The                            patient tolerated the procedure well. The quality  of the bowel preparation was evaluated using the                            BBPS [facility] Bowel Preparation Scale) with scores                            of: Right Colon = 2 (minor amount of residual                            staining, small fragments of stool and/or opaque                            liquid, but mucosa seen well),  Transverse Colon = 3                            (entire mucosa seen well with no residual staining,                            small fragments of stool or opaque liquid) and Left                            Colon = 3 (entire mucosa seen well with no residual                            staining, small fragments of stool or opaque                            liquid). The total BBPS score equals 8. The quality                            of the bowel preparation was good. Scope In: 12:48:50 PM Scope Out: 1:03:53 PM Scope Withdrawal Time: 0 hours 8 minutes 31 seconds  Total Procedure Duration: 0 hours 15 minutes 3 seconds  Findings:      The perianal and digital rectal examinations were normal.      Non-bleeding internal hemorrhoids were found during endoscopy.      Multiple small-mouthed diverticula were found in the sigmoid colon.      Biopsies for histology were taken with a cold forceps from the ascending       colon, transverse colon and descending colon for evaluation of       microscopic colitis.      The exam was otherwise without abnormality. Impression:               - Non-bleeding internal hemorrhoids.                           - Diverticulosis in the sigmoid colon.                           - The examination was otherwise normal.                           - Biopsies were taken with a cold forceps from the  ascending colon, transverse colon and descending                            colon for evaluation of microscopic colitis. Moderate Sedation:      Per Anesthesia Care Recommendation:           - Patient has a contact number available for                            emergencies. The signs and symptoms of potential                            delayed complications were discussed with the                            patient. Return to normal activities tomorrow.                            Written discharge instructions were provided to the                             patient.                           - Resume previous diet.                           - Continue present medications.                           - Await pathology results.                           - Repeat colonoscopy in 5 years for surveillance.                           - Return to GI clinic in 3 months. Procedure Code(s):        --- Professional ---                           [id number], Colonoscopy, flexible; with biopsy, single                            or multiple Diagnosis Code(s):        --- Professional ---                           K64.8, Other hemorrhoids                           K52.9, Noninfective gastroenteritis and colitis,                            unspecified                           K57.30, Diverticulosis of large intestine without  perforation or abscess without bleeding CPT copyright 2019 American Medical Association. All rights reserved. The codes documented in this report are preliminary and upon coder review may  be revised to meet current compliance requirements. Elon Alas. Abbey Chatters, DO [place] Abbey Chatters, DO 12/22/2019 1:06:16 PM This report has been signed electronically. Number of Addenda: 0

## 2019-12-22 NOTE — Transfer of Care (Signed)
Immediate Anesthesia Transfer of Care Note  Patient: Pam Mcneil  Procedure(s) Performed: COLONOSCOPY WITH PROPOFOL (N/A ) ESOPHAGOGASTRODUODENOSCOPY (EGD) WITH PROPOFOL (N/A ) BALLOON DILATION (N/A ) BIOPSY  Patient Location: PACU  Anesthesia Type:General  Level of Consciousness: sedated  Airway & Oxygen Therapy: Patient Spontanous Breathing  Post-op Assessment: Report given to RN and Post -op Vital signs reviewed and stable  Post vital signs: Reviewed and stable  Last Vitals:  Vitals Value Taken Time  BP    Temp    Pulse    Resp    SpO2      Last Pain:  Vitals:   12/22/19 1232  TempSrc:   PainSc: 6          Complications: No complications documented.

## 2019-12-22 NOTE — H&P (Signed)
Primary Care Physician:  Fernanda Drum Primary Gastroenterologist:  Dr. Marletta Lor  Pre-Procedure History & Physical: HPI:  Pam Mcneil is a 59 y.o. female is here for an EGD due to dysphagia and reflux as well as colonoscopy to be performed for family history of colon cancer and chronic diarrhea. Patient denies any family history of colorectal cancer.  No melena or hematochezia.  No abdominal pain or unintentional weight loss.  No change in bowel habits.  Overall feels well from a GI standpoint.  Past Medical History:  Diagnosis Date  . B12 deficiency   . Back pain   . Chronic pain   . Edema   . Epilepsy (HCC)   . Hypertension   . Leg pain   . Low vitamin D level   . Lumbar radiculopathy   . Migraine   . Neck pain   . Post concussion syndrome   . Seizures (HCC)    Last seizues 04/2019; sees neurology  . Tinnitus   . Tremor     Past Surgical History:  Procedure Laterality Date  . BACK SURGERY    . CARPAL TUNNEL RELEASE    . CHOLECYSTECTOMY    . LAPAROSCOPIC HYSTERECTOMY    . NECK SURGERY    . rib removal    . SHOULDER SURGERY     open    Prior to Admission medications   Medication Sig Start Date End Date Taking? Authorizing Provider  ascorbic acid (VITAMIN C) 500 MG tablet Take 500 mg by mouth daily.   Yes [provider]  aspirin 81 MG EC tablet Take 81 mg by mouth daily.    Yes [provider]  BLACK COHOSH PO Take 2 tablets by mouth every evening.   Yes [provider]  celecoxib (CELEBREX) 200 MG capsule Take 200 mg by mouth daily.  09/09/19  Yes [provider]  Cholecalciferol (DIALYVITE VITAMIN D3 MAX) 1.25 MG (50000 UT) TABS Take 50,000 Units by mouth every Monday.    Yes [provider]  cyclobenzaprine (FLEXERIL) 10 MG tablet Take 10 mg by mouth 2 (two) times daily.  09/09/19  Yes [provider]  diclofenac (VOLTAREN) 50 MG EC tablet Take 50 mg by mouth 2 (two) times daily.   Yes [provider]   dicyclomine (BENTYL) 10 MG capsule Take 1 capsule (10 mg total) by mouth 3 (three) times daily as needed for spasms (or diarrhea). Patient taking differently: Take 10 mg by mouth 4 (four) times daily as needed for spasms (or diarrhea).  10/29/19  Yes Anice Paganini, NP  diphenhydrAMINE (BENADRYL) 25 MG tablet Take 25 mg by mouth daily.   Yes [provider]  estradiol (ESTRACE) 0.5 MG tablet Take 0.5 mg by mouth daily.  09/09/19  Yes [provider]  Evening Primrose Oil 1000 MG CAPS Take 1,000 mg by mouth 2 (two) times daily.    Yes [provider]  furosemide (LASIX) 20 MG tablet Take 20 mg by mouth daily.  08/19/19  Yes [provider]  L-Lysine 1000 MG TABS Take 1,000 mg by mouth daily.   Yes [provider]  levETIRAcetam (KEPPRA) 500 MG tablet Take 500 mg by mouth 2 (two) times daily.  09/09/19  Yes [provider]  lisinopril (ZESTRIL) 20 MG tablet Take 20 mg by mouth daily.  09/09/19  Yes [provider]  loperamide (IMODIUM A-D) 2 MG tablet Take 2 mg by mouth 4 (four) times daily as needed for diarrhea or loose  stools.   Yes [provider]  loratadine (CLARITIN) 10 MG tablet Take 10 mg by mouth every evening.    Yes [provider]  MELATONIN-PYRIDOXINE PO Take 1 tablet by mouth at bedtime as needed (sleep).   Yes [provider]  Multiple Vitamin (MULTIVITAMIN WITH MINERALS) TABS tablet Take 1 tablet by mouth daily.   Yes [provider]  Multiple Vitamins-Minerals (VISION PLUS) CAPS Take 1 capsule by mouth daily.   Yes [provider]  naproxen sodium (ALEVE) 220 MG tablet Take 220 mg by mouth 2 (two) times daily as needed (pain).   Yes [provider]  nortriptyline (PAMELOR) 50 MG capsule Take 100 mg by mouth at bedtime.  10/21/19  Yes [provider]  Nutritional Supplements (MENOPAUSE FORMULA) TABS Take 1 tablet by mouth daily.   Yes [provider]  Omega-3  Fatty Acids (FISH OIL) 1000 MG CAPS Take 1,000 mg by mouth 3 (three) times daily.    Yes [provider]  ondansetron (ZOFRAN) 4 MG tablet Take 4 mg by mouth every 8 (eight) hours as needed for nausea or vomiting.  10/12/19  Yes [provider]  pantoprazole (PROTONIX) 40 MG tablet Take 1 tablet (40 mg total) by mouth 2 (two) times daily before a meal. 10/29/19  Yes Anice Paganini, NP  PARoxetine (PAXIL) 40 MG tablet Take 40 mg by mouth daily.  10/14/19  Yes [provider]  pregabalin (LYRICA) 100 MG capsule Take 100 mg by mouth 2 (two) times daily.  10/22/19  Yes [provider]  Probiotic Product (PROBIOTIC PO) Take 1 capsule by mouth daily.    Yes [provider]  topiramate (TOPAMAX) 50 MG tablet Take 50 mg by mouth 2 (two) times daily.  09/09/19  Yes [provider]    Allergies as of 10/30/2019  . (No Known Allergies)    History reviewed. No pertinent family history.  Social History   Socioeconomic History  . Marital status: Single    Spouse name: Not on file  . Number of children: Not on file  . Years of education: Not on file  . Highest education level: Not on file  Occupational History  . Not on file  Tobacco Use  . Smoking status: Never Smoker  . Smokeless tobacco: Never Used  Substance and Sexual Activity  . Alcohol use: Never  . Drug use: Never  . Sexual activity: Not on file  Other Topics Concern  . Not on file  Social History Narrative  . Not on file   Social Determinants of Health   Financial Resource Strain:   . Difficulty of Paying Living Expenses: Not on file  Food Insecurity:   . Worried About Programme researcher, broadcasting/film/video in the Last Year: Not on file  . Ran Out of Food in the Last Year: Not on file  Transportation Needs:   . Lack of Transportation (Medical): Not on file  . Lack of Transportation (Non-Medical): Not on file  Physical Activity:   . Days of Exercise per Week: Not on file  . Minutes of Exercise per  Session: Not on file  Stress:   . Feeling of Stress : Not on file  Social Connections:   . Frequency of Communication with Friends and Family: Not on file  . Frequency of Social Gatherings with Friends and Family: Not on file  . Attends Religious Services: Not on file  . Active Member of Clubs or Organizations: Not on file  .  Attends Banker Meetings: Not on file  . Marital Status: Not on file  Intimate Partner Violence:   . Fear of Current or Ex-Partner: Not on file  . Emotionally Abused: Not on file  . Physically Abused: Not on file  . Sexually Abused: Not on file    Review of Systems: See HPI, otherwise negative ROS  Impression/Plan: Pam Mcneil is here for an EGD due to dysphagia and reflux as well as colonoscopy to be performed for family history of colon cancer and chronic diarrhea.  The risks of the procedure including infection, bleed, or perforation as well as benefits, limitations, alternatives and imponderables have been reviewed with the patient. Questions have been answered. All parties agreeable.

## 2019-12-22 NOTE — Op Note (Signed)
[facility] Patient Name: Pam Mcneil Procedure Date: 12/22/2019 12:22 PM MRN: [id number] Date of Birth: [birth year] Attending MD: Elon Alas. Edgar Frisk CSN: [id number] Age: 59 Admit Type: Outpatient Procedure:                Upper GI endoscopy Indications:              Dysphagia, Heartburn Providers:                Elon Alas. Aleenah Homen, DO, Otis Peak B. Sharon Seller, RN,                            Randa Spike, Technician Referring MD:              Medicines:                See the Anesthesia note for documentation of the                            administered medications Complications:            No immediate complications. Estimated Blood Loss:     Estimated blood loss was minimal. Procedure:                Pre-Anesthesia Assessment:                           - The anesthesia plan was to use monitored                            anesthesia care (MAC).                           After obtaining informed consent, the endoscope was                            passed under direct vision. Throughout the                            procedure, the patient's blood pressure, pulse, and                            oxygen saturations were monitored continuously. The                            GIF-H190 ([id number]) scope was introduced through the                            mouth, and advanced to the second part of duodenum.                            The upper GI endoscopy was accomplished without                            difficulty. The patient tolerated the procedure                            well. Scope In: 36:46:80  PM Scope Out: 12:43:10 PM Total Procedure Duration: 0 hours 6 minutes 54 seconds  Findings:      One benign-appearing, intrinsic mild stenosis was found in the lower       third of the esophagus. The stenosis was traversed. A TTS dilator was       passed through the scope. Dilation with an 18-19-20 mm balloon dilator       was performed to 18 mm. The dilation site was  examined and showed       moderate improvement in luminal narrowing.      Evidence of a Roux-en-Y gastrojejunostomy was found. The gastrojejunal       anastomosis was characterized by healthy appearing mucosa. This was       traversed. The pouch-to-jejunum limb was characterized by erosion and       erythema. The duodenum-to-jejunum limb was not examined as it could not       be reached. Biopsies were taken with a cold forceps for histology. Impression:               - Benign-appearing esophageal stenosis. Dilated.                           - Roux-en-Y gastrojejunostomy with gastrojejunal                            anastomosis characterized by healthy appearing                            mucosa. Biopsied. Moderate Sedation:      Per Anesthesia Care Recommendation:           - Patient has a contact number available for                            emergencies. The signs and symptoms of potential                            delayed complications were discussed with the                            patient. Return to normal activities tomorrow.                            Written discharge instructions were provided to the                            patient.                           - Resume previous diet.                           - Continue present medications.                           - Await pathology results.                           - Repeat upper endoscopy PRN for retreatment.                           -  Use Prilosec (omeprazole) 40 mg PO daily for 8                            weeks. Break open capsules and take with small                            amount of water 30 minutes before breakfast Procedure Code(s):        --- Professional ---                           [phone], Esophagogastroduodenoscopy, flexible,                            transoral; with transendoscopic balloon dilation of                            esophagus (less than 30 mm diameter)                           [id number], 59,  Esophagogastroduodenoscopy, flexible,                            transoral; with biopsy, single or multiple Diagnosis Code(s):        --- Professional ---                           K22.2, Esophageal obstruction                           Z98.0, Intestinal bypass and anastomosis status                           R13.10, Dysphagia, unspecified                           R12, Heartburn CPT copyright 2019 American Medical Association. All rights reserved. The codes documented in this report are preliminary and upon coder review may  be revised to meet current compliance requirements. Elon Alas. Abbey Chatters, DO [place] Abbey Chatters, DO 12/22/2019 12:47:11 PM This report has been signed electronically. Number of Addenda: 0

## 2019-12-22 NOTE — Anesthesia Postprocedure Evaluation (Signed)
Anesthesia Post Note  Patient: Pam Mcneil  Procedure(s) Performed: COLONOSCOPY WITH PROPOFOL (N/A ) ESOPHAGOGASTRODUODENOSCOPY (EGD) WITH PROPOFOL (N/A ) BALLOON DILATION (N/A ) BIOPSY  Patient location during evaluation: PACU Anesthesia Type: General Level of consciousness: awake and alert Pain management: pain level controlled Vital Signs Assessment: post-procedure vital signs reviewed and stable Respiratory status: spontaneous breathing Cardiovascular status: blood pressure returned to baseline Postop Assessment: no apparent nausea or vomiting Anesthetic complications: no   No complications documented.   Last Vitals:  Vitals:   12/22/19 1104  BP: 120/76  Pulse: 83  Resp: 14  Temp: 36.7 C  SpO2: 99%    Last Pain:  Vitals:   12/22/19 1232  TempSrc:   PainSc: 6                  Lorin Glass

## 2019-12-23 ENCOUNTER — Other Ambulatory Visit: Payer: Self-pay

## 2019-12-23 LAB — SURGICAL PATHOLOGY

## 2019-12-25 ENCOUNTER — Encounter (HOSPITAL_COMMUNITY): Payer: Self-pay | Admitting: Internal Medicine

## 2020-01-28 ENCOUNTER — Ambulatory Visit: Payer: Medicare PPO | Admitting: Nurse Practitioner

## 2020-02-04 ENCOUNTER — Other Ambulatory Visit: Payer: Self-pay | Admitting: Nurse Practitioner

## 2020-02-04 DIAGNOSIS — R109 Unspecified abdominal pain: Secondary | ICD-10-CM

## 2020-02-04 DIAGNOSIS — R1319 Other dysphagia: Secondary | ICD-10-CM

## 2020-02-04 DIAGNOSIS — K219 Gastro-esophageal reflux disease without esophagitis: Secondary | ICD-10-CM

## 2020-02-04 DIAGNOSIS — Z800 Family history of malignant neoplasm of digestive organs: Secondary | ICD-10-CM

## 2020-02-04 DIAGNOSIS — R197 Diarrhea, unspecified: Secondary | ICD-10-CM

## 2020-03-30 ENCOUNTER — Ambulatory Visit (INDEPENDENT_AMBULATORY_CARE_PROVIDER_SITE_OTHER): Payer: Medicare HMO | Admitting: Nurse Practitioner

## 2020-03-30 ENCOUNTER — Telehealth: Payer: Self-pay

## 2020-03-30 ENCOUNTER — Other Ambulatory Visit: Payer: Self-pay

## 2020-03-30 ENCOUNTER — Encounter: Payer: Self-pay | Admitting: Nurse Practitioner

## 2020-03-30 VITALS — BP 111/74 | HR 81 | Temp 96.9°F | Ht 72.0 in | Wt 284.8 lb

## 2020-03-30 DIAGNOSIS — K219 Gastro-esophageal reflux disease without esophagitis: Secondary | ICD-10-CM | POA: Diagnosis not present

## 2020-03-30 DIAGNOSIS — R1013 Epigastric pain: Secondary | ICD-10-CM

## 2020-03-30 DIAGNOSIS — R1319 Other dysphagia: Secondary | ICD-10-CM | POA: Diagnosis not present

## 2020-03-30 MED ORDER — DEXLANSOPRAZOLE 60 MG PO CPDR: 60.0000 mg | DELAYED_RELEASE_CAPSULE | Freq: Every day | ORAL | 3 refills | Status: DC

## 2020-03-30 NOTE — Patient Instructions (Signed)
Your health issues we discussed today were:   GERD (reflux/heartburn) with dysphagia (swallowing difficulties) and upper abdominal pain: 1. I sent a prescription for Dexilant (dexlansoprazole) 60 mg.  Take this once a day, pursing in the morning on an empty stomach 2. When you start taking Dexilant, stop taking omeprazole 3. Call us if you have difficulties obtaining the prescription 4. I have put in an order for a swallowing study to evaluate for your swallowing difficulties 5. In the meantime, continue to avoid all NSAIDs (ibuprofen, Motrin, Advil, Aleve, naproxen, Naprosyn, aspirin, Goody's powders, BC powders, anything with "NSAID" on the bottle) 6. Make sure you are chewing your food thoroughly.  Keep lots of fluids on hand while eating and when taking pills 7. Call us in 3 to 4 weeks and let us know how you are doing with the new acid blocker medication, Dexilant 8. Call us for any worsening or severe symptoms  Overall I recommend:  1. Continue other current medications 2. Return for follow-up in 2 months 3. Call us for any questions or concerns   ---------------------------------------------------------------  At [facility] we value your feedback. You may receive a survey about your visit today. Please share your experience as we strive to create trusting relationships with our patients to provide genuine, compassionate, quality care.  We appreciate your understanding and patience as we review any laboratory studies, imaging, and other diagnostic tests that are ordered as we care for you. Our office policy is 5 business days for review of these results, and any emergent or urgent results are addressed in a timely manner for your best interest. If you do not hear from our office in 1 week, please contact us.   We also encourage the use of MyChart, which contains your medical information for your review as well. If you are not enrolled in this feature, an access code is  on this after visit summary for your convenience. Thank you for allowing [place] to be involved in your care.  It was great to see you today!  I hope you have a safe and warm winter!!    ---------------------------------------------------------------

## 2020-03-30 NOTE — Progress Notes (Signed)
Referring Provider: Frederich Balding, MD Primary Care Physician:  Fernanda Drum Primary GI:  Dr. Marletta Lor  Chief Complaint  Patient presents with   Follow-up    F/U from EGD and TCS, black stools in Jan, food still getting stuck    HPI:   Pam Mcneil is a 60 y.o. female who presents for post procedure follow-up on GERD and dysphagia.  Patient was last seen in our office 10/29/2019 for the same.  At her last visit noted diarrhea for 10+ years which he feels is due to medication effect.  Associated pain that improves with a bowel movement, started on Cipro by primary care.  GERD symptoms even with pantoprazole 40 mg daily.  Noted family history of colon cancer in her brother and due for colonoscopy.  Recommended increase Protonix to 40 mg twice daily, start Bentyl 3 times daily as needed, request previous colonoscopy from [place], [place], proceed with colonoscopy and EGD +/- dilation, follow-up in 3 months.  Colonoscopy completed 12/22/2019 which found nonbleeding internal hemorrhoids, sigmoid diverticulosis, otherwise normal.  Random colon biopsies were taken.  Surgical pathology found the biopsies to be benign colonic mucosa with melanosis coli without active inflammation or microscopic colitis.  Recommended repeat colonoscopy in 5 years for high risk colorectal cancer screening.  EGD the same day found benign-appearing esophageal stenosis status post dilation, Roux-en-Y gastrojejunostomy with gastrojejunal anastomosis characterized by healthy-appearing mucosa status post biopsy.  Surgical pathology found the gastric biopsy to be benign with mild reactive changes negative for H. Pylori.  Recommended continue PPI, take the medication by opening the capsule and taking with water due to gastric bypass.  Today she states she doing okay overall. She has been on omeprazole 40 mg daily. She is still having GERD symptoms despite PPI. Was on Protonix which was changed to omeprazole. No other Rx PPIs  before. Still with some swallowing difficulties with big pills, which she cuts in half to help. Also has persistent solid food dysphagia with most meals. In January she had one episode of dark stools and PCP stopped ASA, Celebrex, and Bentyl. She doesn't take Aleve, only uses Tylenol as needed. Denies N/V, hematochezia, fever, chills, unintentional weight loss. Denies URI or flu-like symptoms. Denies loss of sense of taste or smell. The patient has not received COVID-19 vaccination(s). They are not interested in vaccine scheduling information. Denies chest pain, dyspnea, dizziness, lightheadedness, syncope, near syncope. Denies any other upper or lower GI symptoms.  Past Medical History:  Diagnosis Date   B12 deficiency    Back pain    Chronic pain    Edema    Epilepsy (HCC)    Hypertension    Leg pain    Low vitamin D level    Lumbar radiculopathy    Migraine    Neck pain    Post concussion syndrome    Seizures (HCC)    Last seizues 04/2019; sees neurology   Tinnitus    Tremor     Past Surgical History:  Procedure Laterality Date   BACK SURGERY     BALLOON DILATION N/A 12/22/2019   Procedure: BALLOON DILATION;  Surgeon: Lanelle Bal, DO;  [facility];  Service: Endoscopy;  Laterality: N/A;   BIOPSY  12/22/2019   Procedure: BIOPSY;  Surgeon: Lanelle Bal, DO;  [facility]: [facility];  Service: Endoscopy;;  gastric   CARPAL TUNNEL RELEASE     CHOLECYSTECTOMY     COLONOSCOPY WITH PROPOFOL N/A 12/22/2019   Procedure:  COLONOSCOPY WITH PROPOFOL;  Surgeon: Lanelle Bal, DO;  [facility]: [facility];  Service: Endoscopy;  Laterality: N/A;  12:30pm   ESOPHAGOGASTRODUODENOSCOPY (EGD) WITH PROPOFOL N/A 12/22/2019   Procedure: ESOPHAGOGASTRODUODENOSCOPY (EGD) WITH PROPOFOL;  Surgeon: Lanelle Bal, DO;  [facility];  Service: Endoscopy;  Laterality: N/A;   LAPAROSCOPIC HYSTERECTOMY     NECK SURGERY     rib removal      SHOULDER SURGERY     open    Current Outpatient Medications  Medication Sig Dispense Refill   ascorbic acid (VITAMIN C) 500 MG tablet Take 500 mg by mouth daily.     BLACK COHOSH PO Take 2 tablets by mouth every evening.     Cholecalciferol (DIALYVITE VITAMIN D3 MAX) 1.25 MG (50000 UT) TABS Take 50,000 Units by mouth every Monday.      cyclobenzaprine (FLEXERIL) 10 MG tablet Take 10 mg by mouth 2 (two) times daily.      estradiol (ESTRACE) 0.5 MG tablet Take 0.5 mg by mouth daily.      Evening Primrose Oil 1000 MG CAPS Take 1,000 mg by mouth 2 (two) times daily.      furosemide (LASIX) 20 MG tablet Take 20 mg by mouth daily.      L-Lysine 1000 MG TABS Take 1,000 mg by mouth daily.     levETIRAcetam (KEPPRA) 500 MG tablet Take 500 mg by mouth 2 (two) times daily.      lisinopril (ZESTRIL) 20 MG tablet Take 20 mg by mouth daily.      loratadine (CLARITIN) 10 MG tablet Take 10 mg by mouth every evening.      MELATONIN-PYRIDOXINE PO Take 1 tablet by mouth at bedtime as needed (sleep).     Multiple Vitamin (MULTIVITAMIN WITH MINERALS) TABS tablet Take 1 tablet by mouth daily.     Multiple Vitamins-Minerals (VISION PLUS) CAPS Take 1 capsule by mouth daily.     nortriptyline (PAMELOR) 50 MG capsule Take 100 mg by mouth at bedtime.      Omega-3 Fatty Acids (FISH OIL) 1000 MG CAPS Take 1,000 mg by mouth 3 (three) times daily.      omeprazole (PRILOSEC) 40 MG capsule Take 1 capsule (40 mg total) by mouth daily. Open up capsule and take with water. 30 capsule 5   ondansetron (ZOFRAN) 4 MG tablet Take 4 mg by mouth as needed for nausea or vomiting.     PARoxetine (PAXIL) 40 MG tablet Take 40 mg by mouth daily.      pregabalin (LYRICA) 100 MG capsule Take 100 mg by mouth 2 (two) times daily.      Probiotic Product (PROBIOTIC PO) Take 1 capsule by mouth daily.      topiramate (TOPAMAX) 50 MG tablet Take 50 mg by mouth 2 (two) times daily.      No current facility-administered  medications for this visit.    Allergies as of 03/30/2020   (No Known Allergies)    Family History  Problem Relation Age of Onset   Colon cancer Brother    Gastric cancer Neg Hx    Esophageal cancer Neg Hx     Social History   Socioeconomic History   Marital status: Single    Spouse name: Not on file   Number of children: Not on file   Years of education: Not on file   Highest education level: Not on file  Occupational History   Not on file  Tobacco Use   Smoking status: Never  Smoker   Smokeless tobacco: Never Used  Substance and Sexual Activity   Alcohol use: Never   Drug use: Never   Sexual activity: Not on file  Other Topics Concern   Not on file  Social History Narrative   Not on file   Social Determinants of Health   Financial Resource Strain: Not on file  Food Insecurity: Not on file  Transportation Needs: Not on file  Physical Activity: Not on file  Stress: Not on file  Social Connections: Not on file    Subjective: Review of Systems  Constitutional: Negative for chills, fever, malaise/fatigue and weight loss.  HENT: Negative for congestion and sore throat.   Respiratory: Negative for cough and shortness of breath.   Cardiovascular: Negative for chest pain and palpitations.  Gastrointestinal: Positive for abdominal pain and heartburn. Negative for blood in stool, diarrhea, melena, nausea and vomiting.       Dysphagia  Musculoskeletal: Negative for joint pain and myalgias.  Skin: Negative for rash.  Neurological: Negative for dizziness and weakness.  Endo/Heme/Allergies: Does not bruise/bleed easily.  Psychiatric/Behavioral: Negative for depression. The patient is not nervous/anxious.   All other systems reviewed and are negative.    Objective: BP 111/74    Pulse 81    Temp (!) 96.9 F (36.1 C) (Temporal)    Ht 6' (1.829 m)    Wt 284 lb 12.8 oz (129.2 kg)    BMI 38.63 kg/m  Physical Exam Vitals and nursing note reviewed.   Constitutional:      General: She is not in acute distress.    Appearance: Normal appearance. She is well-developed. She is obese. She is not ill-appearing, toxic-appearing or diaphoretic.  HENT:     Head: Normocephalic and atraumatic.     Nose: No congestion or rhinorrhea.  Eyes:     General: No scleral icterus. Cardiovascular:     Rate and Rhythm: Normal rate and regular rhythm.     Heart sounds: Normal heart sounds.  Pulmonary:     Effort: Pulmonary effort is normal. No respiratory distress.     Breath sounds: Normal breath sounds.  Abdominal:     General: Bowel sounds are normal.     Palpations: Abdomen is soft. There is no hepatomegaly, splenomegaly or mass.     Tenderness: There is abdominal tenderness in the epigastric area. There is no guarding or rebound.     Hernia: No hernia is present.  Skin:    General: Skin is warm and dry.     Coloration: Skin is not jaundiced.     Findings: No rash.  Neurological:     General: No focal deficit present.     Mental Status: She is alert and oriented to person, place, and time.  Psychiatric:        Attention and Perception: Attention normal.        Mood and Affect: Mood normal.        Speech: Speech normal.        Behavior: Behavior normal.        Thought Content: Thought content normal.        Cognition and Memory: Cognition and memory normal.      Assessment:  Pleasant 60 year old female presents for follow-up on GERD, abdominal pain, dysphagia.  No red flag/warning signs or symptoms currently.  GERD and dysphagia with epigastric abdominal pain: Recent EGD essentially unremarkable, status post empiric dilation.  She states she is still having swallowing difficulties including solid food dysphagia and  pill dysphagia.  She breaks her pills in half or open the capsules to help.  However, opening capsules is more difficult due to her hand tremors.  At this point she is tried omeprazole and pantoprazole with persistent GERD  symptoms.  I will trial her on Dexilant 60 mg daily, hold omeprazole while on Dexilant.  I will also check a barium pill esophagram for dysmotility disorders that could be alleviated with speech-language pathology.  Avoid all NSAIDs, chewing and swallowing precautions were reinforced.  Requesting progress report on Dexilant in 3 to 4 weeks.  Call us if difficulties filling the prescription   Plan: 1. Hold omeprazole 2. Start Dexilant 60 mg daily 3. Barium pill esophagram 4. Avoid all NSAIDs 5. Progress report in 3 to 4 weeks 6. Call us in 2 months.    Thank you for allowing [place] to participate in the care of Rachel Moulds, DNP, AGNP-C Adult & Gerontological Nurse Practitioner [facility]   03/30/2020 10:34 AM   Disclaimer: This note was dictated with voice recognition software. Similar sounding words can inadvertently be transcribed and may not be corrected upon review.

## 2020-03-30 NOTE — Telephone Encounter (Signed)
BPE scheduled for 04/06/20 at 10:30am, arrive at 10:15am. NPO 3 hours prior to test.   Called and informed pt of appt. Letter mailed.

## 2020-04-06 ENCOUNTER — Other Ambulatory Visit: Payer: Self-pay

## 2020-04-06 ENCOUNTER — Ambulatory Visit (HOSPITAL_COMMUNITY)
Admission: RE | Admit: 2020-04-06 | Discharge: 2020-04-06 | Disposition: A | Discharge status: Home / Self Care | Payer: Medicare HMO | Source: Ambulatory Visit | Attending: Nurse Practitioner | Admitting: Nurse Practitioner

## 2020-04-06 DIAGNOSIS — R1013 Epigastric pain: Secondary | ICD-10-CM | POA: Insufficient documentation

## 2020-04-06 DIAGNOSIS — K219 Gastro-esophageal reflux disease without esophagitis: Secondary | ICD-10-CM | POA: Insufficient documentation

## 2020-04-06 DIAGNOSIS — R1319 Other dysphagia: Secondary | ICD-10-CM | POA: Insufficient documentation

## 2020-04-13 ENCOUNTER — Telehealth: Payer: Self-pay | Admitting: Internal Medicine

## 2020-04-13 NOTE — Telephone Encounter (Signed)
Pt calling for results on a test she had done in radiology on 04/06/2020. [phone]

## 2020-04-13 NOTE — Telephone Encounter (Signed)
Pam Mcneil, This pt had a GES on 04/06/2020 and I found it , but you haven't read and released it to me yet. She is anxiously awaiting her results.

## 2020-04-14 ENCOUNTER — Telehealth: Payer: Self-pay | Admitting: Internal Medicine

## 2020-04-14 NOTE — Telephone Encounter (Signed)
noted 

## 2020-04-14 NOTE — Telephone Encounter (Signed)
Dr. Marletta Lor please look in the procedure notes of this pt to see if you seen and responded to pts colonoscopy and endoscopy.   Wynne Dust please check in the imaging tab from 04/06/2020 for this pt's results of her GES test.      This pt keeps calling about results and I had to hunt to find these reports myself. Please check out these dates when you all have time.

## 2020-04-14 NOTE — Telephone Encounter (Signed)
Pt calling back again to follow up on the call yesterday about getting her results. I told her that the nurse was waiting on reply from the  Provider on would call her back. [phone]

## 2020-04-15 ENCOUNTER — Other Ambulatory Visit: Payer: Self-pay

## 2020-04-15 DIAGNOSIS — R1319 Other dysphagia: Secondary | ICD-10-CM

## 2020-04-15 NOTE — Telephone Encounter (Signed)
See result note for details. She needs manometry based on BPE results.

## 2020-04-15 NOTE — Telephone Encounter (Signed)
See result note.  

## 2020-05-26 ENCOUNTER — Other Ambulatory Visit: Payer: Self-pay

## 2020-05-26 ENCOUNTER — Telehealth: Payer: Self-pay

## 2020-05-26 DIAGNOSIS — R1319 Other dysphagia: Secondary | ICD-10-CM

## 2020-05-26 NOTE — Telephone Encounter (Signed)
Pam Mcneil from [facility] needs to speak with schedulers for procedures @ [phone].

## 2020-05-26 NOTE — Telephone Encounter (Signed)
Noted, thanks for the FYI. 

## 2020-05-26 NOTE — Telephone Encounter (Signed)
Called Beth at [facility], she wanted to let our office know she has been out and pt's esophageal manometry was delayed getting scheduled. She is scheduled for manometry 06/06/20. F/u OV with Wynne Dust NP is 06/02/20. Informed Beth I would leave f/u as scheduled d/t Minerva Areola will be leaving the practice.  FYI to [facility].

## 2020-05-30 ENCOUNTER — Other Ambulatory Visit: Payer: Self-pay

## 2020-05-30 DIAGNOSIS — K219 Gastro-esophageal reflux disease without esophagitis: Secondary | ICD-10-CM

## 2020-05-30 DIAGNOSIS — R1319 Other dysphagia: Secondary | ICD-10-CM

## 2020-05-31 ENCOUNTER — Other Ambulatory Visit: Payer: Self-pay

## 2020-05-31 DIAGNOSIS — Z1159 Encounter for screening for other viral diseases: Secondary | ICD-10-CM

## 2020-06-02 ENCOUNTER — Other Ambulatory Visit: Payer: Self-pay

## 2020-06-02 ENCOUNTER — Other Ambulatory Visit (HOSPITAL_COMMUNITY)
Admission: RE | Admit: 2020-06-02 | Discharge: 2020-06-02 | Disposition: A | Discharge status: Home / Self Care | Payer: Medicare HMO | Source: Ambulatory Visit | Attending: Gastroenterology | Admitting: Gastroenterology

## 2020-06-02 ENCOUNTER — Ambulatory Visit: Payer: Medicare HMO | Admitting: Nurse Practitioner

## 2020-06-02 DIAGNOSIS — Z01812 Encounter for preprocedural laboratory examination: Secondary | ICD-10-CM | POA: Insufficient documentation

## 2020-06-02 DIAGNOSIS — Z20822 Contact with and (suspected) exposure to covid-19: Secondary | ICD-10-CM | POA: Diagnosis not present

## 2020-06-02 LAB — SARS CORONAVIRUS 2 (TAT 6-24 HRS): SARS Coronavirus 2: NEGATIVE

## 2020-06-06 ENCOUNTER — Encounter (HOSPITAL_COMMUNITY)
Admission: RE | Disposition: A | Discharge status: Home / Self Care | Payer: Self-pay | Source: Home / Self Care | Attending: Gastroenterology

## 2020-06-06 ENCOUNTER — Encounter (HOSPITAL_COMMUNITY): Payer: Self-pay | Admitting: Gastroenterology

## 2020-06-06 ENCOUNTER — Ambulatory Visit (HOSPITAL_COMMUNITY)
Admission: RE | Admit: 2020-06-06 | Discharge: 2020-06-06 | Disposition: A | Discharge status: Home / Self Care | Payer: Medicare HMO | Attending: Gastroenterology | Admitting: Gastroenterology

## 2020-06-06 DIAGNOSIS — R1310 Dysphagia, unspecified: Secondary | ICD-10-CM | POA: Insufficient documentation

## 2020-06-06 DIAGNOSIS — R1319 Other dysphagia: Secondary | ICD-10-CM

## 2020-06-06 DIAGNOSIS — K224 Dyskinesia of esophagus: Secondary | ICD-10-CM | POA: Diagnosis not present

## 2020-06-06 HISTORY — PX: ESOPHAGEAL MANOMETRY: SHX5429

## 2020-06-06 SURGERY — MANOMETRY, ESOPHAGUS
Anesthesia: Choice

## 2020-06-06 MED ORDER — LIDOCAINE VISCOUS HCL 2 % MT SOLN
OROMUCOSAL | Status: AC
  Filled 2020-06-06: qty 15

## 2020-06-06 SURGICAL SUPPLY — 2 items
FACESHIELD LNG OPTICON STERILE (SAFETY) IMPLANT
GLOVE BIO SURGEON STRL SZ8 (GLOVE) ×4 IMPLANT

## 2020-06-06 NOTE — Progress Notes (Signed)
Esophageal Manometry done per protocol. Patient tolerated well without distress or complication. Manometry probe removed per protocol. Report to be sent to Dr. Marsa Aris.

## 2020-06-07 ENCOUNTER — Encounter (HOSPITAL_COMMUNITY): Payer: Self-pay | Admitting: Gastroenterology

## 2020-06-13 ENCOUNTER — Telehealth: Payer: Self-pay

## 2020-06-13 NOTE — Telephone Encounter (Signed)
[employer] Plan provided her with a temporary supply of Dexlansopraz cap 60 mg dr. This was filled on 06/11/2020. This is not on there formulary. Insurance will not pay for this drug after the pt has received the maximum 30 day temporary supply, unless you obtain a formulary exception from them.

## 2020-06-15 NOTE — Telephone Encounter (Signed)
Can we find out from the insurance company exactly what is covered.  They provide a list of formulary alternatives for, medications, but Dexilant is not on her.  I feel it would likely be pantoprazole and omeprazole (which are formulary alternatives listed for Nexium).  However, she has tried and failed both of these.  50 formulary alternatives are only pantoprazole (Protonix) or omeprazole (Prilosec).  And please start a prior authorization as she has tried and failed both of these.

## 2020-06-15 NOTE — Telephone Encounter (Signed)
noted 

## 2020-06-16 ENCOUNTER — Telehealth: Payer: Self-pay

## 2020-06-16 NOTE — Telephone Encounter (Signed)
Approved   Pt aware, her pharmacy aware, given to Darl Pikes to scan into the chart

## 2020-06-16 NOTE — Telephone Encounter (Signed)
[employer] Medicare select plan(HMO-POS) authorizes Dexlansoprazole Cap DR for 03/15/2020-02/11/2021 as long as this pt remains with Medicare part D Rx drug plan. Will call her pharmacy and also notify the pt as well.

## 2020-07-07 ENCOUNTER — Telehealth: Payer: Self-pay

## 2020-07-07 DIAGNOSIS — K224 Dyskinesia of esophagus: Secondary | ICD-10-CM

## 2020-07-07 NOTE — Telephone Encounter (Signed)
Manometry report faxed to referring provider. Original to [facility] to be scanned into Epic.

## 2020-07-14 ENCOUNTER — Encounter: Payer: Self-pay | Admitting: Internal Medicine

## 2020-07-14 ENCOUNTER — Other Ambulatory Visit: Payer: Self-pay

## 2020-07-14 ENCOUNTER — Telehealth: Payer: Self-pay | Admitting: Internal Medicine

## 2020-07-14 ENCOUNTER — Ambulatory Visit: Payer: Medicare HMO | Admitting: Internal Medicine

## 2020-07-14 VITALS — BP 111/79 | HR 70 | Temp 96.8°F | Ht 72.0 in | Wt 284.8 lb

## 2020-07-14 DIAGNOSIS — R1319 Other dysphagia: Secondary | ICD-10-CM | POA: Diagnosis not present

## 2020-07-14 DIAGNOSIS — K219 Gastro-esophageal reflux disease without esophagitis: Secondary | ICD-10-CM | POA: Diagnosis not present

## 2020-07-14 DIAGNOSIS — K224 Dyskinesia of esophagus: Secondary | ICD-10-CM | POA: Diagnosis not present

## 2020-07-14 MED ORDER — FAMOTIDINE 40 MG PO TABS: 40.0000 mg | ORAL_TABLET | Freq: Every day | ORAL | 5 refills | Status: DC

## 2020-07-14 MED ORDER — DILTIAZEM HCL 60 MG PO TABS: 60.0000 mg | ORAL_TABLET | Freq: Four times a day (QID) | ORAL | 5 refills | Status: DC

## 2020-07-14 NOTE — Telephone Encounter (Signed)
Checked the pt's chart to look at address first, then advised Dr. Marletta Lor of this and he's looking into what the pt is talking about

## 2020-07-14 NOTE — Telephone Encounter (Signed)
Pt called to say that her prescription went to [facility], but it needed to go to [facility] on [address], [place]

## 2020-07-14 NOTE — Patient Instructions (Signed)
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  We need to maximize acid suppression.  I will add famotidine 40 mg nightly to your already 60 mg of Dexilant daily.  We will consider starting you on a new medication called diltiazem 60 mg 4 times daily.  This medication will both slow your heart rate and lower your blood pressure.  I will discuss with your primary care physician about what to do in regards to your lisinopril.  Follow-up in 8 weeks or sooner if needed.  At [facility] we value your feedback. You may receive a survey about your visit today. Please share your experience as we strive to create trusting relationships with our patients to provide genuine, compassionate, quality care.  We appreciate your understanding and patience as we review any laboratory studies, imaging, and other diagnostic tests that are ordered as we care for you. Our office policy is 5 business days for review of these results, and any emergent or urgent results are addressed in a timely manner for your best interest. If you do not hear from our office in 1 week, please contact us.   We also encourage the use of MyChart, which contains your medical information for your review as well. If you are not enrolled in this feature, an access code is on this after visit summary for your convenience. Thank you for allowing [place] to be involved in your care.  It was great to see you today!  I hope you have a great rest of your summer!!    Hennie Duos. Marletta Lor, D.O. Gastroenterology and Hepatology [facility]

## 2020-07-14 NOTE — Progress Notes (Signed)
Referring Provider: Fernanda Drum Primary Care Physician:  Fernanda Drum Primary GI:  Dr. Marletta Lor  Chief Complaint  Patient presents with  . Diarrhea    Stools are getting more normal since taking womens probiotic  . Gastroesophageal Reflux    Dexilant is helping more. Occ has to do otc med    HPI:   Pam Mcneil is a 60 y.o. female who presents to clinic today for follow-up visit for GERD and dysphagia.  Patient last seen 03/30/2020.  Colonoscopy completed 12/22/2019 which found nonbleeding internal hemorrhoids, sigmoid diverticulosis, otherwise normal.  Random colon biopsies were taken.  Surgical pathology found the biopsies to be benign colonic mucosa with melanosis coli without active inflammation or microscopic colitis.  Recommended repeat colonoscopy in 5 years for high risk colorectal cancer screening.  EGD the same day found benign-appearing esophageal stenosis status post dilation, Roux-en-Y gastrojejunostomy with gastrojejunal anastomosis characterized by healthy-appearing mucosa status post biopsy.  Surgical pathology found the gastric biopsy to be benign with mild reactive changes negative for H. Pylori.  Recommended continue PPI, take the medication by opening the capsule and taking with water due to gastric bypass.  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  Today, she states Dexilant is helping though she continues to have breakthrough symptoms of reflux and dysphagia.  Also notes chest pain which is quite frequent.  States she has had a full work-up by cardiology which was negative.  Past Medical History:  Diagnosis Date  . B12 deficiency   . Back pain   . Chronic pain   . Edema   . Epilepsy (HCC)   . Hypertension   . Leg pain   . Low vitamin D level   . Lumbar radiculopathy   . Migraine   . Neck pain   . Post concussion syndrome   . Seizures (HCC)    Last  seizues 04/2019; sees neurology  . Tinnitus   . Tremor     Past Surgical History:  Procedure Laterality Date  . BACK SURGERY    . BALLOON DILATION N/A 12/22/2019   Procedure: BALLOON DILATION;  Surgeon: Lanelle Bal, DO;  [facility];  Service: Endoscopy;  Laterality: N/A;  . BIOPSY  12/22/2019   Procedure: BIOPSY;  Surgeon: Lanelle Bal, DO;  [facility];  Service: Endoscopy;;  gastric  . CARPAL TUNNEL RELEASE    . CHOLECYSTECTOMY    . COLONOSCOPY WITH PROPOFOL N/A 12/22/2019   Procedure: COLONOSCOPY WITH PROPOFOL;  Surgeon: Lanelle Bal, DO;  [facility];  Service: Endoscopy;  Laterality: N/A;  12:30pm  . ESOPHAGEAL MANOMETRY N/A 06/06/2020   Procedure: ESOPHAGEAL MANOMETRY (EM);  Surgeon: Napoleon Form, MD;  Location: [facility];  Service: Endoscopy;  Laterality: N/A;  . ESOPHAGOGASTRODUODENOSCOPY (EGD) WITH PROPOFOL N/A 12/22/2019   Procedure: ESOPHAGOGASTRODUODENOSCOPY (EGD) WITH PROPOFOL;  Surgeon: Lanelle Bal, DO;  [facility];  Service: Endoscopy;  Laterality: N/A;  . LAPAROSCOPIC HYSTERECTOMY    . NECK SURGERY    . rib removal    . SHOULDER SURGERY     open    Current Outpatient Medications  Medication Sig Dispense Refill  . AIMOVIG 140 MG/ML SOAJ Inject 140 mg into the skin every 30 (thirty) days.    Marland Kitchen ascorbic acid (VITAMIN C) 500 MG tablet Take 500 mg by mouth daily.    Marland Kitchen BLACK COHOSH PO Take 1 tablet by mouth every  evening.    . cyclobenzaprine (FLEXERIL) 10 MG tablet Take 10 mg by mouth 2 (two) times daily.     Marland Kitchen dexlansoprazole (DEXILANT) 60 MG capsule Take 1 capsule (60 mg total) by mouth daily. 30 capsule 3  . diltiazem (CARDIZEM) 60 MG tablet Take 1 tablet (60 mg total) by mouth 4 (four) times daily. 120 tablet 5  . estradiol (ESTRACE) 0.5 MG tablet Take 0.5 mg by mouth daily.     . Evening Primrose Oil 1000 MG CAPS Take 1,000 mg by mouth daily.    . famotidine (PEPCID) 40 MG tablet Take 1  tablet (40 mg total) by mouth daily. 30 tablet 5  . furosemide (LASIX) 20 MG tablet Take 20 mg by mouth daily.     Marland Kitchen HYDROcodone-acetaminophen (NORCO) 10-325 MG tablet Take 1 tablet by mouth 4 (four) times daily as needed.    Marland Kitchen L-Lysine 1000 MG TABS Take 1,000 mg by mouth daily.    Marland Kitchen levETIRAcetam (KEPPRA) 500 MG tablet Take 500 mg by mouth 2 (two) times daily.     Marland Kitchen lisinopril (ZESTRIL) 20 MG tablet Take 20 mg by mouth daily.     Marland Kitchen loratadine (CLARITIN) 10 MG tablet Take 10 mg by mouth every evening.     . Multiple Vitamin (MULTIVITAMIN WITH MINERALS) TABS tablet Take 1 tablet by mouth daily.    . Multiple Vitamins-Minerals (VISION PLUS) CAPS Take 1 capsule by mouth daily.    . nortriptyline (PAMELOR) 50 MG capsule Take 100 mg by mouth at bedtime.     . Omega-3 Fatty Acids (FISH OIL) 1000 MG CAPS Take 1,000 mg by mouth 2 (two) times daily.    . ondansetron (ZOFRAN) 4 MG tablet Take 4 mg by mouth as needed for nausea or vomiting.    Marland Kitchen PARoxetine (PAXIL) 40 MG tablet Take 40 mg by mouth daily.     . pregabalin (LYRICA) 100 MG capsule Take 100 mg by mouth 2 (two) times daily.     . Probiotic Product (PROBIOTIC PO) Take 1 capsule by mouth daily.     Marland Kitchen topiramate (TOPAMAX) 50 MG tablet Take 50 mg by mouth 2 (two) times daily.     . Cholecalciferol (DIALYVITE VITAMIN D3 MAX) 1.25 MG (50000 UT) TABS Take 50,000 Units by mouth every Monday.  (Patient not taking: Reported on 07/14/2020)    . MELATONIN-PYRIDOXINE PO Take 1 tablet by mouth at bedtime as needed (sleep).    Marland Kitchen omeprazole (PRILOSEC) 40 MG capsule Take 1 capsule (40 mg total) by mouth daily. Open up capsule and take with water. 30 capsule 5   No current facility-administered medications for this visit.    Allergies as of 07/14/2020  . (No Known Allergies)    Family History  Problem Relation Age of Onset  . Colon cancer Brother   . Gastric cancer Neg Hx   . Esophageal cancer Neg Hx     Social History   Socioeconomic History  .  Marital status: Single    Spouse name: Not on file  . Number of children: Not on file  . Years of education: Not on file  . Highest education level: Not on file  Occupational History  . Not on file  Tobacco Use  . Smoking status: Never Smoker  . Smokeless tobacco: Never Used  Substance and Sexual Activity  . Alcohol use: Never  . Drug use: Never  . Sexual activity: Not on file  Other Topics Concern  . Not on file  Social History Narrative  . Not on file   Social Determinants of Health   Financial Resource Strain: Not on file  Food Insecurity: Not on file  Transportation Needs: Not on file  Physical Activity: Not on file  Stress: Not on file  Social Connections: Not on file    Subjective: Review of Systems  Constitutional: Negative for chills and fever.  HENT: Negative for congestion and hearing loss.   Eyes: Negative for blurred vision and double vision.  Respiratory: Negative for cough and shortness of breath.   Cardiovascular: Positive for chest pain. Negative for palpitations.  Gastrointestinal: Positive for heartburn. Negative for abdominal pain, blood in stool, constipation, diarrhea, melena and vomiting.       Dysphagia  Genitourinary: Negative for dysuria and urgency.  Musculoskeletal: Negative for joint pain and myalgias.  Skin: Negative for itching and rash.  Neurological: Negative for dizziness and headaches.  Psychiatric/Behavioral: Negative for depression. The patient is not nervous/anxious.      Objective: BP 111/79   Pulse 70   Temp (!) 96.8 F (36 C) (Temporal)   Ht 6' (1.829 m)   Wt 284 lb 12.8 oz (129.2 kg)   BMI 38.63 kg/m  Physical Exam Constitutional:      Appearance: Normal appearance.  HENT:     Head: Normocephalic and atraumatic.  Eyes:     Extraocular Movements: Extraocular movements intact.     Conjunctiva/sclera: Conjunctivae normal.  Cardiovascular:     Rate and Rhythm: Normal rate and regular rhythm.  Pulmonary:     Effort:  Pulmonary effort is normal.     Breath sounds: Normal breath sounds.  Abdominal:     General: Bowel sounds are normal.     Palpations: Abdomen is soft.  Musculoskeletal:        General: No swelling. Normal range of motion.     Cervical back: Normal range of motion and neck supple.  Skin:    General: Skin is warm and dry.     Coloration: Skin is not jaundiced.  Neurological:     General: No focal deficit present.     Mental Status: She is alert and oriented to person, place, and time.  Psychiatric:        Mood and Affect: Mood normal.        Behavior: Behavior normal.      Assessment: *Hypercontractile esophagus *GERD *Dysphagia *Chest pain  Plan: Patient with recent manometry indicative of hypercontractile (jackhammer) esophagus.  Discussed this in depth with her today.  We will maximize acid suppression.  Continue on Dexilant 60 mg daily.  I will also add Pepcid 40 mg nightly.  We will trial patient on diltiazem 60 mg 4 times daily.  Discussed case with patient's primary care physician Dr. Genice Rouge.  We will hold her lisinopril for now.  Discussed monitoring both blood pressure and heart rate at home with patient.  She states she has a blood pressure cuff and will do this regularly.  Counseled on potential side effects of dizziness and she understands.  Follow-up in 8 weeks or sooner if needed.  Patient to see her PCP in 2 to 3 weeks for blood pressure check  07/14/2020 1:24 PM   Disclaimer: This note was dictated with voice recognition software. Similar sounding words can inadvertently be transcribed and may not be corrected upon review.

## 2020-08-03 ENCOUNTER — Telehealth: Payer: Self-pay | Admitting: Internal Medicine

## 2020-08-03 NOTE — Telephone Encounter (Signed)
Patient called here to say that her systolic BP number is below 100. Said she was told to call here and let us know.  In her discharge she was told to follow up with her pcp but she said she did not know that.  Said she would look through her mychart notes and contact him

## 2020-08-05 ENCOUNTER — Other Ambulatory Visit: Payer: Self-pay | Admitting: Nurse Practitioner

## 2020-08-05 DIAGNOSIS — R1013 Epigastric pain: Secondary | ICD-10-CM

## 2020-08-05 DIAGNOSIS — R1319 Other dysphagia: Secondary | ICD-10-CM

## 2020-08-05 DIAGNOSIS — K219 Gastro-esophageal reflux disease without esophagitis: Secondary | ICD-10-CM

## 2020-09-07 ENCOUNTER — Ambulatory Visit: Payer: Medicare HMO | Admitting: Internal Medicine

## 2020-09-07 ENCOUNTER — Encounter: Payer: Self-pay | Admitting: Internal Medicine

## 2020-11-03 ENCOUNTER — Encounter: Payer: Self-pay | Admitting: Internal Medicine

## 2020-11-03 ENCOUNTER — Ambulatory Visit: Payer: Medicare HMO | Admitting: Internal Medicine

## 2020-11-03 ENCOUNTER — Other Ambulatory Visit: Payer: Self-pay

## 2020-11-03 VITALS — BP 110/64 | HR 80 | Temp 97.1°F | Ht 72.0 in | Wt 283.8 lb

## 2020-11-03 DIAGNOSIS — R1319 Other dysphagia: Secondary | ICD-10-CM | POA: Diagnosis not present

## 2020-11-03 DIAGNOSIS — K224 Dyskinesia of esophagus: Secondary | ICD-10-CM

## 2020-11-03 DIAGNOSIS — K219 Gastro-esophageal reflux disease without esophagitis: Secondary | ICD-10-CM

## 2020-11-03 DIAGNOSIS — K5900 Constipation, unspecified: Secondary | ICD-10-CM

## 2020-11-03 NOTE — Progress Notes (Signed)
Referring Provider: Fernanda Drum Primary Care Physician:  Fernanda Drum Primary GI:  Dr. Marletta Lor  Chief Complaint  Patient presents with   Gastroesophageal Reflux    Food comes back up. Eating soft foods   Dysphagia    HPI:   Pam Mcneil is a 60 y.o. female who presents to clinic today for follow-up visit for GERD and dysphagia.  Patient last seen 07/14/2020.  Colonoscopy completed 12/22/2019 which found nonbleeding internal hemorrhoids, sigmoid diverticulosis, otherwise normal.  Random colon biopsies were taken.  Surgical pathology found the biopsies to be benign colonic mucosa with melanosis coli without active inflammation or microscopic colitis.  Recommended repeat colonoscopy in 5 years for high risk colorectal cancer screening.   EGD the same day found benign-appearing esophageal stenosis status post dilation, Roux-en-Y gastrojejunostomy with gastrojejunal anastomosis characterized by healthy-appearing mucosa status post biopsy.  Surgical pathology found the gastric biopsy to be benign with mild reactive changes negative for H. Pylori.  Recommended continue PPI, take the medication by opening the capsule and taking with water due to gastric bypass.  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  Currently maintained on Dexilant 60 mg daily, Pepcid 40 mg nightly added on previous visit.  We also started her on diltiazem 60 mg 4 times daily.  I did discuss case with her PCP prior and we decided to put her lisinopril on hold.  Today, she states that she is improved.  Notes she continues to have episodes of chest pain and regurgitation though the frequency has decreased since starting diltiazem.  Blood pressure has been stable at home.  Does note that she has had worsening constipation since starting the medication however.  Past Medical History:  Diagnosis Date   B12 deficiency    Back  pain    Chronic pain    Edema    Epilepsy (HCC)    Hypertension    Leg pain    Low vitamin D level    Lumbar radiculopathy    Migraine    Neck pain    Post concussion syndrome    Seizures (HCC)    Last seizues 04/2019; sees neurology   Tinnitus    Tremor     Past Surgical History:  Procedure Laterality Date   BACK SURGERY     BALLOON DILATION N/A 12/22/2019   Procedure: BALLOON DILATION;  Surgeon: Lanelle Bal, DO;  [facility];  Service: Endoscopy;  Laterality: N/A;   BIOPSY  12/22/2019   Procedure: BIOPSY;  Surgeon: Lanelle Bal, DO;  [facility];  Service: Endoscopy;;  gastric   CARPAL TUNNEL RELEASE     CHOLECYSTECTOMY     COLONOSCOPY WITH PROPOFOL N/A 12/22/2019   Procedure: COLONOSCOPY WITH PROPOFOL;  Surgeon: Lanelle Bal, DO;  [facility];  Service: Endoscopy;  Laterality: N/A;  12:30pm   ESOPHAGEAL MANOMETRY N/A 06/06/2020   Procedure: ESOPHAGEAL MANOMETRY (EM);  Surgeon: Napoleon Form, MD;  [facility]: [facility];  Service: Endoscopy;  Laterality: N/A;   ESOPHAGOGASTRODUODENOSCOPY (EGD) WITH PROPOFOL N/A 12/22/2019   Procedure: ESOPHAGOGASTRODUODENOSCOPY (EGD) WITH PROPOFOL;  Surgeon: Lanelle Bal, DO;  [facility];  Service: Endoscopy;  Laterality: N/A;   LAPAROSCOPIC HYSTERECTOMY     NECK SURGERY     rib removal     SHOULDER SURGERY     open    Current Outpatient Medications  Medication Sig Dispense Refill   AIMOVIG 140  MG/ML SOAJ Inject 140 mg into the skin every 30 (thirty) days.     ascorbic acid (VITAMIN C) 500 MG tablet Take 500 mg by mouth daily.     BLACK COHOSH PO Take 1 tablet by mouth every evening.     cyclobenzaprine (FLEXERIL) 10 MG tablet Take 10 mg by mouth 2 (two) times daily.      dexlansoprazole (DEXILANT) 60 MG capsule TAKE 1 CAPSULE BY MOUTH DAILY 30 capsule 3   diltiazem (CARDIZEM) 60 MG tablet Take 1 tablet (60 mg total) by mouth 4 (four) times daily. 120 tablet 5    estradiol (ESTRACE) 0.5 MG tablet Take 0.5 mg by mouth daily.      Evening Primrose Oil 1000 MG CAPS Take 1,000 mg by mouth daily.     famotidine (PEPCID) 40 MG tablet Take 1 tablet (40 mg total) by mouth daily. 30 tablet 5   furosemide (LASIX) 20 MG tablet Take 20 mg by mouth daily.      HYDROcodone-acetaminophen (NORCO) 10-325 MG tablet Take 1 tablet by mouth 4 (four) times daily as needed.     L-Lysine 1000 MG TABS Take 1,000 mg by mouth daily.     levETIRAcetam (KEPPRA) 500 MG tablet Take 500 mg by mouth 2 (two) times daily.      lisinopril (ZESTRIL) 20 MG tablet Take 20 mg by mouth daily.      loratadine (CLARITIN) 10 MG tablet Take 10 mg by mouth every evening.      Multiple Vitamin (MULTIVITAMIN WITH MINERALS) TABS tablet Take 1 tablet by mouth daily.     Multiple Vitamins-Minerals (VISION PLUS) CAPS Take 1 capsule by mouth daily.     nortriptyline (PAMELOR) 50 MG capsule Take 100 mg by mouth at bedtime.      Omega-3 Fatty Acids (FISH OIL) 1000 MG CAPS Take 1,000 mg by mouth 2 (two) times daily.     ondansetron (ZOFRAN) 4 MG tablet Take 4 mg by mouth as needed for nausea or vomiting.     PARoxetine (PAXIL) 40 MG tablet Take 40 mg by mouth daily.      pregabalin (LYRICA) 100 MG capsule Take 100 mg by mouth 2 (two) times daily.      Probiotic Product (PROBIOTIC PO) Take 1 capsule by mouth daily.      topiramate (TOPAMAX) 50 MG tablet Take 50 mg by mouth 2 (two) times daily.      No current facility-administered medications for this visit.    Allergies as of 11/03/2020   (No Known Allergies)    Family History  Problem Relation Age of Onset   Colon cancer Brother    Gastric cancer Neg Hx    Esophageal cancer Neg Hx     Social History   Socioeconomic History   Marital status: Single    Spouse name: Not on file   Number of children: Not on file   Years of education: Not on file   Highest education level: Not on file  Occupational History   Not on file  Tobacco Use    Smoking status: Never   Smokeless tobacco: Never  Substance and Sexual Activity   Alcohol use: Never   Drug use: Never   Sexual activity: Not on file  Other Topics Concern   Not on file  Social History Narrative   Not on file   Social Determinants of Health   Financial Resource Strain: Not on file  Food Insecurity: Not on file  Transportation Needs: Not on  file  Physical Activity: Not on file  Stress: Not on file  Social Connections: Not on file    Subjective: Review of Systems  Constitutional:  Negative for chills and fever.  HENT:  Negative for congestion and hearing loss.   Eyes:  Negative for blurred vision and double vision.  Respiratory:  Negative for cough and shortness of breath.   Cardiovascular:  Positive for chest pain. Negative for palpitations.  Gastrointestinal:  Positive for heartburn. Negative for abdominal pain, blood in stool, constipation, diarrhea, melena and vomiting.       Dysphagia  Genitourinary:  Negative for dysuria and urgency.  Musculoskeletal:  Negative for joint pain and myalgias.  Skin:  Negative for itching and rash.  Neurological:  Negative for dizziness and headaches.  Psychiatric/Behavioral:  Negative for depression. The patient is not nervous/anxious.     Objective: BP 110/64   Pulse 80   Temp (!) 97.1 F (36.2 C) (Temporal)   Ht 6' (1.829 m)   Wt 283 lb 12.8 oz (128.7 kg)   BMI 38.49 kg/m  Physical Exam Constitutional:      Appearance: Normal appearance.  HENT:     Head: Normocephalic and atraumatic.  Eyes:     Extraocular Movements: Extraocular movements intact.     Conjunctiva/sclera: Conjunctivae normal.  Cardiovascular:     Rate and Rhythm: Normal rate and regular rhythm.  Pulmonary:     Effort: Pulmonary effort is normal.     Breath sounds: Normal breath sounds.  Abdominal:     General: Bowel sounds are normal.     Palpations: Abdomen is soft.  Musculoskeletal:        General: No swelling. Normal range of motion.      Cervical back: Normal range of motion and neck supple.  Skin:    General: Skin is warm and dry.     Coloration: Skin is not jaundiced.  Neurological:     General: No focal deficit present.     Mental Status: She is alert and oriented to person, place, and time.  Psychiatric:        Mood and Affect: Mood normal.        Behavior: Behavior normal.     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  Discussed this in depth with her today.  We will maximize acid suppression.  Continue on Dexilant 60 mg daily and Pepcid 40 mg nightly.  Symptom frequency has decreased on diltiazem.  We will continue.  Recommend she add peppermint oil before meals and as needed for breakthrough chest pain.  Constipation likely side effect from her diltiazem. recommend adding MiraLAX (polyethylene glycol) 1 capful daily.  If this is not adequate she can go up to 2 capfuls daily.  If this still is not adequate then I would add 1 dose of Dulcolax (bisacodyl) daily.  Follow-up in 3 to 4 months.  11/03/2020 10:18 AM   Disclaimer: This note was dictated with voice recognition software. Similar sounding words can inadvertently be transcribed and may not be corrected upon review.

## 2020-11-03 NOTE — Patient Instructions (Signed)
I am happy to hear that the episodes have decreased in frequency.  Continue on diltiazem.  I would add peppermint oil before meals and as needed for when chest pain occurs.  Continue on Dexilant and famotidine.  For your constipation, recommend adding MiraLAX (polyethylene glycol) 1 capful daily.  If this is not adequate you can go up to 2 capfuls daily.  If this still is not adequate then I would add 1 dose of Dulcolax (bisacodyl) daily.  Follow-up in 3 to 4 months.  It was great seeing you again today.  Dr. Marletta Lor  At [facility] we value your feedback. You may receive a survey about your visit today. Please share your experience as we strive to create trusting relationships with our patients to provide genuine, compassionate, quality care.  We appreciate your understanding and patience as we review any laboratory studies, imaging, and other diagnostic tests that are ordered as we care for you. Our office policy is 5 business days for review of these results, and any emergent or urgent results are addressed in a timely manner for your best interest. If you do not hear from our office in 1 week, please contact us.   We also encourage the use of MyChart, which contains your medical information for your review as well. If you are not enrolled in this feature, an access code is on this after visit summary for your convenience. Thank you for allowing [place] to be involved in your care.  It was great to see you today!  I hope you have a great rest of your summer!!    Pam Mcneil. Marletta Lor, D.O. Gastroenterology and Hepatology [facility]

## 2020-12-03 ENCOUNTER — Other Ambulatory Visit: Payer: Self-pay | Admitting: Gastroenterology

## 2020-12-03 DIAGNOSIS — R1319 Other dysphagia: Secondary | ICD-10-CM

## 2020-12-03 DIAGNOSIS — R1013 Epigastric pain: Secondary | ICD-10-CM

## 2020-12-03 DIAGNOSIS — K219 Gastro-esophageal reflux disease without esophagitis: Secondary | ICD-10-CM

## 2021-01-09 ENCOUNTER — Other Ambulatory Visit: Payer: Self-pay | Admitting: Internal Medicine

## 2021-03-09 ENCOUNTER — Encounter: Payer: Self-pay | Admitting: Internal Medicine

## 2021-05-10 ENCOUNTER — Other Ambulatory Visit: Payer: Self-pay

## 2021-05-10 ENCOUNTER — Ambulatory Visit: Payer: Medicare PPO | Admitting: Internal Medicine

## 2021-05-10 VITALS — BP 112/64 | HR 80 | Temp 96.9°F | Ht 72.0 in | Wt 292.4 lb

## 2021-05-10 DIAGNOSIS — K219 Gastro-esophageal reflux disease without esophagitis: Secondary | ICD-10-CM

## 2021-05-10 DIAGNOSIS — K224 Dyskinesia of esophagus: Secondary | ICD-10-CM

## 2021-05-10 DIAGNOSIS — R1319 Other dysphagia: Secondary | ICD-10-CM

## 2021-05-10 NOTE — Patient Instructions (Signed)
I am happy to hear that you are doing well.  Continue on diltiazem. ?  ?I would add peppermint oil before meals and as needed for when chest pain occurs. ?  ?Continue on Dexilant.  Okay to hold your famotidine.  If your symptoms return then I would go back on this medication. ? ?For your constipation, I will give you samples of Linzess 145 mcg daily.  You may have an initial washout period with this medication.  We have room to both decrease or increase this dosage as needed.  Please let us know in a week or so if improved and I will send in formal prescription. ?  ?Follow-up with GI in 6 months. ? ?It was great seeing you again today. ? ?Dr. Abbey Chatters ? ?At [facility] we value your feedback. You may receive a survey about your visit today. Please share your experience as we strive to create trusting relationships with our patients to provide genuine, compassionate, quality care. ? ?We appreciate your understanding and patience as we review any laboratory studies, imaging, and other diagnostic tests that are ordered as we care for you. Our office policy is 5 business days for review of these results, and any emergent or urgent results are addressed in a timely manner for your best interest. If you do not hear from our office in 1 week, please contact us.  ? ?We also encourage the use of MyChart, which contains your medical information for your review as well. If you are not enrolled in this feature, an access code is on this after visit summary for your convenience. Thank you for allowing [place] to be involved in your care. ? ?It was great to see you today!  I hope you have a great rest of your Winter! ? ? ? ?Elon Alas. Abbey Chatters, D.O. ?Gastroenterology and Hepatology ?[facility] ? ?

## 2021-05-10 NOTE — Progress Notes (Signed)
? ? ?Referring Provider: Genice RougeSubuhi, [place] ?Primary Care Physician:  Fernanda DrumSubuhi, [place] ?Primary GI:  Dr. Marletta Lorarver ? ?Chief Complaint  ?Patient presents with  ? Follow-up  ? ? ?HPI:   ?Pam EvaCheryl Mcneil is a 61 y.o. female who presents to clinic today for follow-up visit for GERD and dysphagia, constipation, hypercontractile esophagus ? ?Colonoscopy completed 12/22/2019 which found nonbleeding internal hemorrhoids, sigmoid diverticulosis, otherwise normal.  Random colon biopsies were taken.  Surgical pathology found the biopsies to be benign colonic mucosa with melanosis coli without active inflammation or microscopic colitis.  Recommended repeat colonoscopy in 5 years for high risk colorectal cancer screening. ?  ?EGD the same day found benign-appearing esophageal stenosis status post dilation, Roux-en-Y gastrojejunostomy with gastrojejunal anastomosis characterized by healthy-appearing mucosa status post biopsy.  Surgical pathology found the gastric biopsy to be benign with mild reactive changes negative for H. Pylori.  Recommended continue PPI, take the medication by opening the capsule and taking with water due to gastric bypass. ? ?She subsequently underwent esophageal manometry which showed findings consistent with hypercontractile esophagus with DCI >13,000. ? ?Currently maintained on Dexilant 60 mg daily, Pepcid 40 mg nightly added on previous visit.  We also started her on diltiazem 60 mg 4 times daily.  I did discuss case with her PCP prior and we decided to put her lisinopril on hold. ? ?Today, she states that she is improved.  Notes she continues to have episodes of chest pain and regurgitation though the frequency has decreased since starting diltiazem.  Blood pressure has been stable at home.   ? ?Does note that she has had worsening constipation since starting the medication however.  Trialed MiraLAX over-the-counter.  She states that she was unable to tolerate this due to the gritty nature of the  medication.  Continues to have constipation.  1 bowel movement every day though small, [place] chart 1.  Does note straining. ? ?Past Medical History:  ?Diagnosis Date  ? B12 deficiency   ? Back pain   ? Chronic pain   ? Edema   ? Epilepsy (HCC)   ? Hypertension   ? Leg pain   ? Low vitamin D level   ? Lumbar radiculopathy   ? Migraine   ? Neck pain   ? Post concussion syndrome   ? Seizures (HCC)   ? Last seizues 04/2019; sees neurology  ? Tinnitus   ? Tremor   ? ? ?Past Surgical History:  ?Procedure Laterality Date  ? BACK SURGERY    ? BALLOON DILATION N/A 12/22/2019  ? Procedure: BALLOON DILATION;  Surgeon: Lanelle Balarver, Onofre Gains K, DO;  Location: [facility];  Service: Endoscopy;  Laterality: N/A;  ? BIOPSY  12/22/2019  ? Procedure: BIOPSY;  Surgeon: Lanelle Balarver, Adryanna Friedt K, DO;  Location: [facility];  Service: Endoscopy;;  gastric  ? CARPAL TUNNEL RELEASE    ? CHOLECYSTECTOMY    ? COLONOSCOPY WITH PROPOFOL N/A 12/22/2019  ? Procedure: COLONOSCOPY WITH PROPOFOL;  Surgeon: Lanelle Balarver, Laureano Hetzer K, DO;  Location: [facility];  Service: Endoscopy;  Laterality: N/A;  12:30pm  ? ESOPHAGEAL MANOMETRY N/A 06/06/2020  ? Procedure: ESOPHAGEAL MANOMETRY (EM);  Surgeon: Napoleon FormNandigam, Kavitha V, MD;  Location: [facility];  Service: Endoscopy;  Laterality: N/A;  ? ESOPHAGOGASTRODUODENOSCOPY (EGD) WITH PROPOFOL N/A 12/22/2019  ? Procedure: ESOPHAGOGASTRODUODENOSCOPY (EGD) WITH PROPOFOL;  Surgeon: Lanelle Balarver, Jermall Isaacson K, DO;  Location: [facility];  Service: Endoscopy;  Laterality: N/A;  ? LAPAROSCOPIC HYSTERECTOMY    ? NECK SURGERY    ?  rib removal    ? SHOULDER SURGERY    ? open  ? ? ?Current Outpatient Medications  ?Medication Sig Dispense Refill  ? AIMOVIG 140 MG/ML SOAJ Inject 140 mg into the skin every 30 (thirty) days.    ? ascorbic acid (VITAMIN C) 500 MG tablet Take 500 mg by mouth daily.    ? BLACK COHOSH PO Take 1 tablet by mouth every evening.    ? cyclobenzaprine (FLEXERIL) 10 MG tablet Take 10 mg by mouth 2 (two) times daily.     ?  dexlansoprazole (DEXILANT) 60 MG capsule TAKE 1 CAPSULE BY MOUTH DAILY 30 capsule 5  ? diltiazem (CARDIZEM) 60 MG tablet TAKE 1 TABLET(60 MG) BY MOUTH FOUR TIMES DAILY 120 tablet 5  ? estradiol (ESTRACE) 0.5 MG tablet Take 0.5 mg by mouth daily.     ? Evening Primrose Oil 1000 MG CAPS Take 1,000 mg by mouth daily.    ? famotidine (PEPCID) 40 MG tablet TAKE 1 TABLET(40 MG) BY MOUTH DAILY 30 tablet 5  ? furosemide (LASIX) 20 MG tablet Take 20 mg by mouth daily.     ? HYDROcodone-acetaminophen (NORCO) 10-325 MG tablet Take 1 tablet by mouth 4 (four) times daily as needed.    ? L-Lysine 1000 MG TABS Take 1,000 mg by mouth daily.    ? levETIRAcetam (KEPPRA) 500 MG tablet Take 500 mg by mouth 2 (two) times daily.     ? loratadine (CLARITIN) 10 MG tablet Take 10 mg by mouth every evening.     ? Multiple Vitamin (MULTIVITAMIN WITH MINERALS) TABS tablet Take 1 tablet by mouth daily.    ? Multiple Vitamins-Minerals (VISION PLUS) CAPS Take 1 capsule by mouth daily.    ? nortriptyline (PAMELOR) 50 MG capsule Take 100 mg by mouth at bedtime.     ? Omega-3 Fatty Acids (FISH OIL) 1000 MG CAPS Take 1,000 mg by mouth 2 (two) times daily.    ? ondansetron (ZOFRAN) 4 MG tablet Take 4 mg by mouth as needed for nausea or vomiting.    ? PARoxetine (PAXIL) 40 MG tablet Take 40 mg by mouth daily.     ? pregabalin (LYRICA) 100 MG capsule Take 100 mg by mouth 2 (two) times daily.     ? Probiotic Product (PROBIOTIC PO) Take 1 capsule by mouth daily.     ? topiramate (TOPAMAX) 50 MG tablet Take 50 mg by mouth 2 (two) times daily.     ? lisinopril (ZESTRIL) 20 MG tablet Take 20 mg by mouth daily.     ? ?No current facility-administered medications for this visit.  ? ? ?Allergies as of 05/10/2021  ? (No Known Allergies)  ? ? ?Family History  ?Problem Relation Age of Onset  ? Colon cancer Brother   ? Gastric cancer Neg Hx   ? Esophageal cancer Neg Hx   ? ? ?Social History  ? ?Socioeconomic History  ? Marital status: Single  ?  Spouse name: Not on  file  ? Number of children: Not on file  ? Years of education: Not on file  ? Highest education level: Not on file  ?Occupational History  ? Not on file  ?Tobacco Use  ? Smoking status: Never  ? Smokeless tobacco: Never  ?Substance and Sexual Activity  ? Alcohol use: Never  ? Drug use: Never  ? Sexual activity: Not on file  ?Other Topics Concern  ? Not on file  ?Social History Narrative  ? Not on file  ? ?  Social Determinants of Health  ? ?Financial Resource Strain: Not on file  ?Food Insecurity: Not on file  ?Transportation Needs: Not on file  ?Physical Activity: Not on file  ?Stress: Not on file  ?Social Connections: Not on file  ? ? ?Subjective: ?Review of Systems  ?Constitutional:  Negative for chills and fever.  ?HENT:  Negative for congestion and hearing loss.   ?Eyes:  Negative for blurred vision and double vision.  ?Respiratory:  Negative for cough and shortness of breath.   ?Cardiovascular:  Positive for chest pain. Negative for palpitations.  ?Gastrointestinal:  Positive for heartburn. Negative for abdominal pain, blood in stool, constipation, diarrhea, melena and vomiting.  ?     Dysphagia  ?Genitourinary:  Negative for dysuria and urgency.  ?Musculoskeletal:  Negative for joint pain and myalgias.  ?Skin:  Negative for itching and rash.  ?Neurological:  Negative for dizziness and headaches.  ?Psychiatric/Behavioral:  Negative for depression. The patient is not nervous/anxious.   ? ? ?Objective: ?BP 112/64   Pulse 80   Temp (!) 96.9 ?F (36.1 ?C)   Ht 6' (1.829 m)   Wt 292 lb 6.4 oz (132.6 kg)   BMI 39.66 kg/m?  ?Physical Exam ?Constitutional:   ?   Appearance: Normal appearance.  ?HENT:  ?   Head: Normocephalic and atraumatic.  ?Eyes:  ?   Extraocular Movements: Extraocular movements intact.  ?   Conjunctiva/sclera: Conjunctivae normal.  ?Cardiovascular:  ?   Rate and Rhythm: Normal rate and regular rhythm.  ?Pulmonary:  ?   Effort: Pulmonary effort is normal.  ?   Breath sounds: Normal breath sounds.   ?Abdominal:  ?   General: Bowel sounds are normal.  ?   Palpations: Abdomen is soft.  ?Musculoskeletal:     ?   General: No swelling. Normal range of motion.  ?   Cervical back: Normal range of motion and neck supple

## 2021-05-30 ENCOUNTER — Telehealth: Payer: Self-pay | Admitting: Internal Medicine

## 2021-05-30 NOTE — Telephone Encounter (Signed)
PATIENT CALLED AND SAID THE LINZESS 145 SAMPLES WORKED AND SHE WOULD LIKE A PRESCRIPTION SENT TO [facility] IN [place] ON [address]  ?

## 2021-05-30 NOTE — Telephone Encounter (Signed)
The pt would like to have a Rx for Linzess 145 sent to her pharmacy. ?

## 2021-06-01 ENCOUNTER — Other Ambulatory Visit: Payer: Self-pay | Admitting: Internal Medicine

## 2021-06-01 MED ORDER — LINACLOTIDE 145 MCG PO CAPS: 145.0000 ug | ORAL_CAPSULE | Freq: Every day | ORAL | 3 refills | Status: DC

## 2021-06-01 NOTE — Telephone Encounter (Signed)
Prescription for Linzess 145 mcg sent to pharmacy. ?

## 2021-06-02 NOTE — Telephone Encounter (Signed)
Noted. Pt notified.  

## 2021-06-12 ENCOUNTER — Telehealth: Payer: Self-pay | Admitting: Internal Medicine

## 2021-06-12 NOTE — Telephone Encounter (Signed)
Pt said her pharmacist never received her Linzess prescription. I verified the pharmacy was [facility] in [place] on [address]. She said, yes, that was correct. I told her we have a confirmed receipt that they received it on 06/01/2021, but they are telling her, No, they didn't receive it. Please advise ?

## 2021-06-13 ENCOUNTER — Telehealth: Payer: Self-pay | Admitting: Internal Medicine

## 2021-06-13 MED ORDER — LINACLOTIDE 145 MCG PO CAPS: 145.0000 ug | ORAL_CAPSULE | Freq: Every day | ORAL | 3 refills | Status: AC

## 2021-06-13 NOTE — Telephone Encounter (Signed)
noted 

## 2021-06-13 NOTE — Telephone Encounter (Signed)
Linzess 145 mcg daily sent to [facility]. ?

## 2021-06-13 NOTE — Telephone Encounter (Signed)
Tried to phone [facility] once again on hold for 6 minutes. Dr. Marletta Lor please resend the pt's Rx for Linzess again to pharmacy and I will advise the pt to check there later today to see if they have it. ?

## 2021-06-13 NOTE — Telephone Encounter (Signed)
Phoned to [facility] this am was put on hold for 10 minutes. Will try back later ?

## 2021-06-13 NOTE — Telephone Encounter (Signed)
Pt wanted to let the nurse know that she picked up her linzess from the pharmacy.  ?

## 2021-06-13 NOTE — Telephone Encounter (Signed)
Noted ? ?Pt made aware that Rx has been sent in and to give them a call after 3pm to which she agreed ?

## 2021-07-04 ENCOUNTER — Telehealth: Payer: Self-pay | Admitting: *Deleted

## 2021-07-04 NOTE — Telephone Encounter (Signed)
Received drug change request from [facility].  Pt presently taking Dexlansoprazole 60 mg daily.  Drug not covered by patient plan.  Recommended alternative Omeprazole.  Pt last seen 05/10/2021.

## 2021-07-17 ENCOUNTER — Telehealth: Payer: Self-pay

## 2021-07-17 MED ORDER — FAMOTIDINE 40 MG PO TABS: ORAL_TABLET | ORAL | 5 refills | Status: DC

## 2021-07-17 NOTE — Addendum Note (Signed)
Addended by: Gelene Mink on: 07/17/2021 03:08 PM   Modules accepted: Orders

## 2021-07-17 NOTE — Telephone Encounter (Signed)
Pt wants a refill on Famotidine 40 mg. Pt's last ov was 05/10/2021. She wants it sent to [facility] in [place], [place] on [address]

## 2021-07-31 ENCOUNTER — Telehealth: Payer: Self-pay

## 2021-07-31 NOTE — Telephone Encounter (Signed)
Pt needs a RF on her Diltiazem 60 mg tablets. Her last ov was 05/10/2021.

## 2021-08-02 ENCOUNTER — Other Ambulatory Visit: Payer: Self-pay | Admitting: Internal Medicine

## 2021-08-02 MED ORDER — DILTIAZEM HCL 60 MG PO TABS: 60.0000 mg | ORAL_TABLET | Freq: Four times a day (QID) | ORAL | 11 refills | Status: DC

## 2021-08-02 MED ORDER — OMEPRAZOLE 40 MG PO CPDR: 40.0000 mg | DELAYED_RELEASE_CAPSULE | Freq: Two times a day (BID) | ORAL | 3 refills | Status: DC

## 2021-08-02 NOTE — Telephone Encounter (Signed)
noted 

## 2021-08-02 NOTE — Telephone Encounter (Signed)
Done, thanks

## 2021-08-07 ENCOUNTER — Telehealth: Payer: Self-pay | Admitting: Internal Medicine

## 2021-08-07 ENCOUNTER — Telehealth: Payer: Self-pay

## 2021-08-07 NOTE — Telephone Encounter (Signed)
error 

## 2021-09-27 ENCOUNTER — Encounter: Payer: Self-pay | Admitting: *Deleted

## 2021-12-13 ENCOUNTER — Telehealth: Payer: Self-pay

## 2021-12-13 NOTE — Telephone Encounter (Signed)
PA done for Dexilant DR 60 mg capsules. Dx used K21.9. pt was denied. An appeal was done today because the pt has tried/failed: Pantoprazole DR, Omeprazole DR and Famotidine 40 mg. PA request form from [facility] preferred alternative [employer]XBasic. Faxed an waiting on a response.

## 2021-12-13 NOTE — Telephone Encounter (Signed)
Pt's appeal was denied for Dexlant DR 60 mg cap. Pt has tried several Rx's but no record of trying esomeprazole magnesium capsule delayed release. Documentation for review on your desk in blue folder. Pt return back to me once done so it can be scanned into pt's chart.

## 2021-12-14 ENCOUNTER — Other Ambulatory Visit: Payer: Self-pay | Admitting: Internal Medicine

## 2021-12-14 MED ORDER — ESOMEPRAZOLE MAGNESIUM 40 MG PO CPDR: 40.0000 mg | DELAYED_RELEASE_CAPSULE | Freq: Two times a day (BID) | ORAL | 11 refills | Status: AC

## 2021-12-14 NOTE — Telephone Encounter (Signed)
Please let her know her insurance company is requiring her to try generic Nexium before covering [place].  I will send this in twice a day.  Thank you

## 2021-12-15 NOTE — Telephone Encounter (Signed)
Phoned and advised the pt of what the insurance stated about her Dexilant and what she needed to do. Pt advised of Rx sent in for generic Nexium and instructions of the Rx. Pt experienced understanding of this.

## 2022-02-07 ENCOUNTER — Other Ambulatory Visit: Payer: Self-pay | Admitting: Gastroenterology

## 2022-04-30 ENCOUNTER — Telehealth: Payer: Self-pay

## 2022-04-30 NOTE — Telephone Encounter (Signed)
Manometry report scanned in under media from [facility] for review.

## 2022-06-22 IMAGING — RF DG ESOPHAGUS
11 series · 12 of 24 positions shown · non-contrast
Comparison: None

CLINICAL DATA: Persistent dysphagia, difficulty swallowing pills,
large pills stick in cervical region; GERD, epigastric pain, prior
gastric surgery

EXAM:
ESOPHOGRAM / BARIUM SWALLOW / BARIUM TABLET STUDY
TECHNIQUE: Combined double contrast and single contrast examination performed
using effervescent crystals, thick barium liquid, and thin barium
liquid. The patient was observed with fluoroscopy swallowing a 13 mm
barium sulphate tablet.
FLUOROSCOPY TIME:  Fluoroscopy Time:  2 minutes 48 seconds
Radiation Exposure Index (if provided by the fluoroscopic device):
87.3 mGy
Number of Acquired Spot Images: None

[Series 1: cp_standard · 0.18mm/px · 1 of 158 frames shown (1 of 11)]
[frame 80/158]
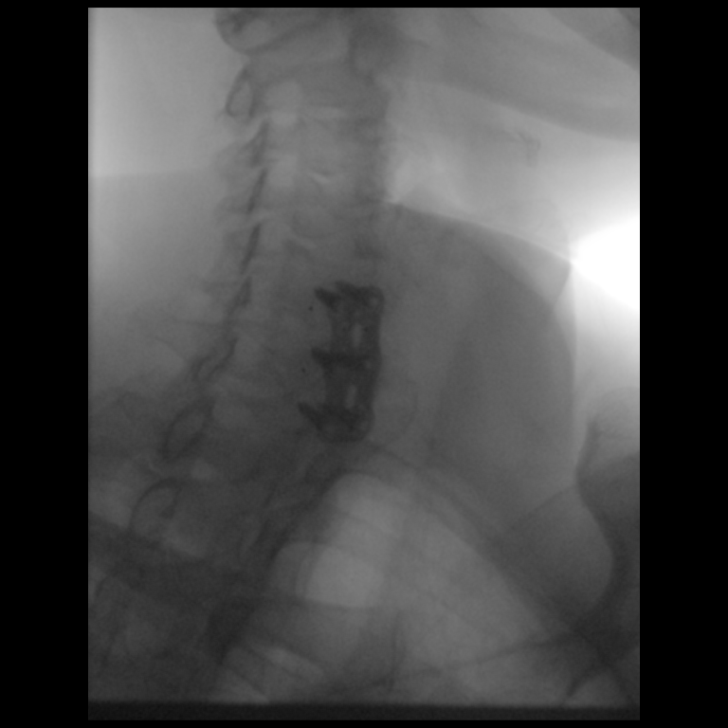

[Series 2: cp_standard · 0.18mm/px · 1 of 134 frames shown (2 of 11)]
[frame 68/134]
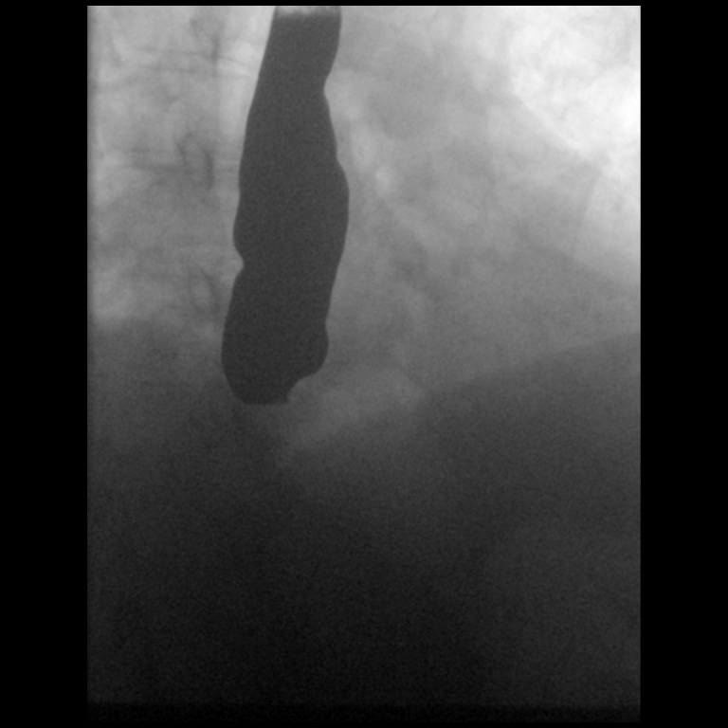

[Series 3: cp_standard · 0.18mm/px · 1 of 64 frames shown (3 of 11)]
[frame 10/64]
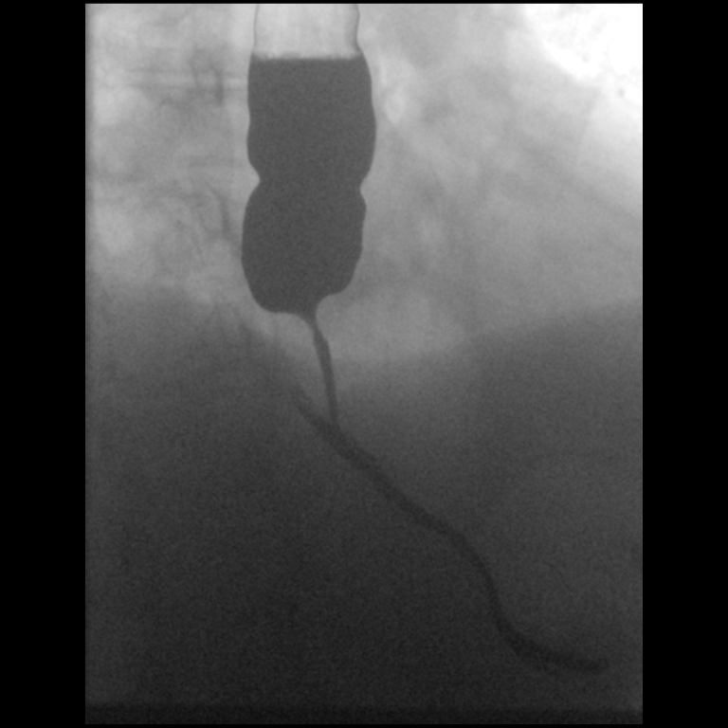

[Series 4: cp_standard · 0.18mm/px · 1 of 113 frames shown (4 of 11)]
[frame 57/113]
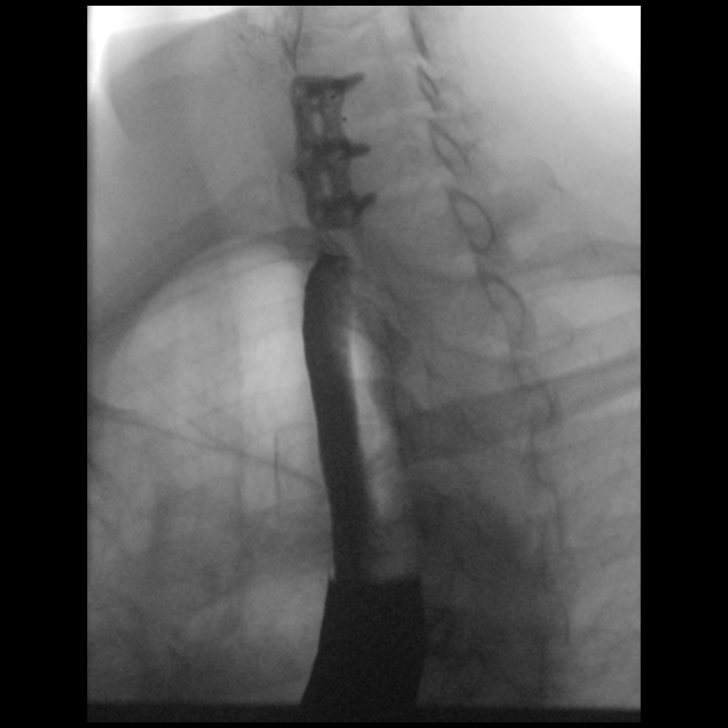

[Series 5: cp_standard · 0.18mm/px · 1 of 83 frames shown (5 of 11)]
[frame 13/83]
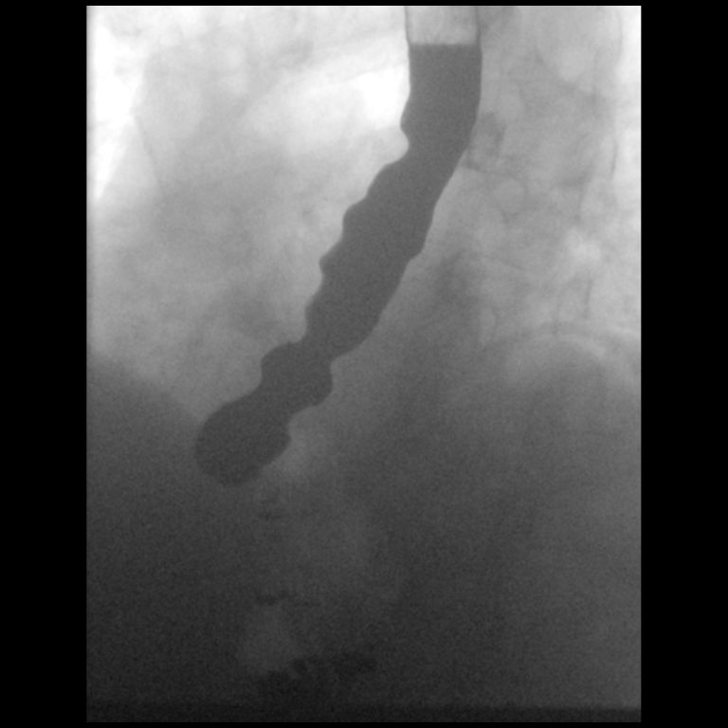

[Series 6: cp_standard · 0.26mm/px · 2 of 106 frames shown (6 of 11)]
[frame 16/106]
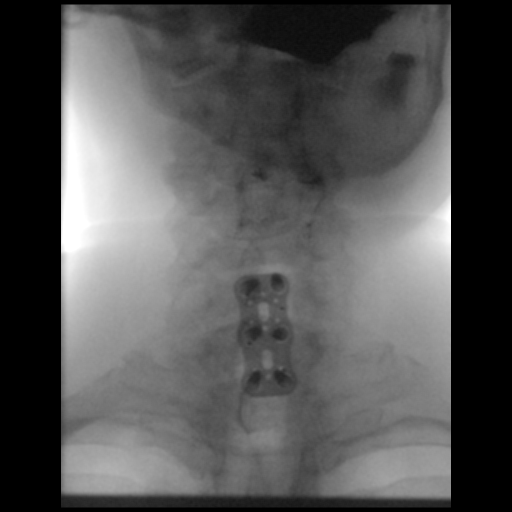
[frame 101/106]
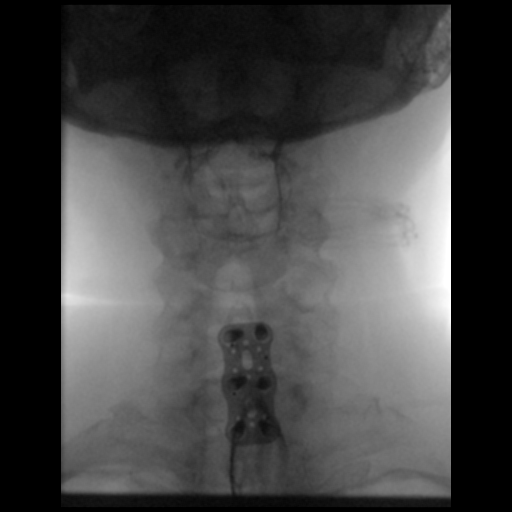

[Series 7: cp_standard · 0.25mm/px · 1 of 87 frames shown (7 of 11)]
[frame 44/87]
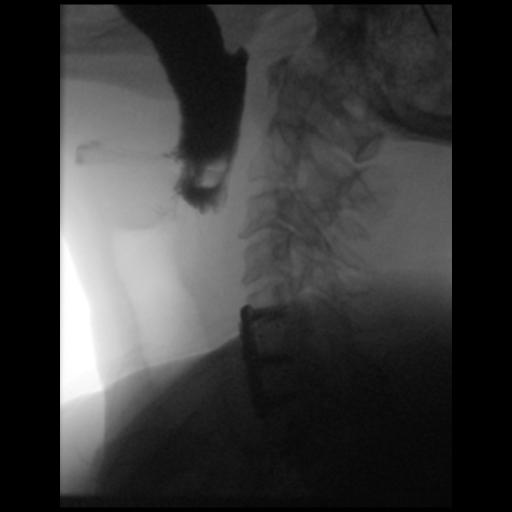

[Series 8: cp_standard · 0.25mm/px · 1 of 260 frames shown (8 of 11)]
[frame 131/260]
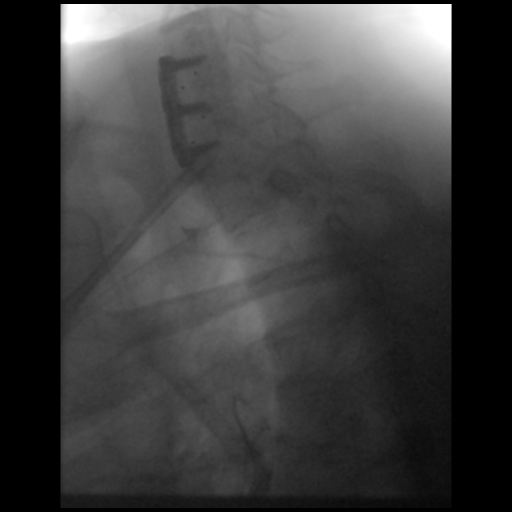

[Series 9: cp_standard · 0.17mm/px · 1 of 170 frames shown (9 of 11)]
[frame 86/170]
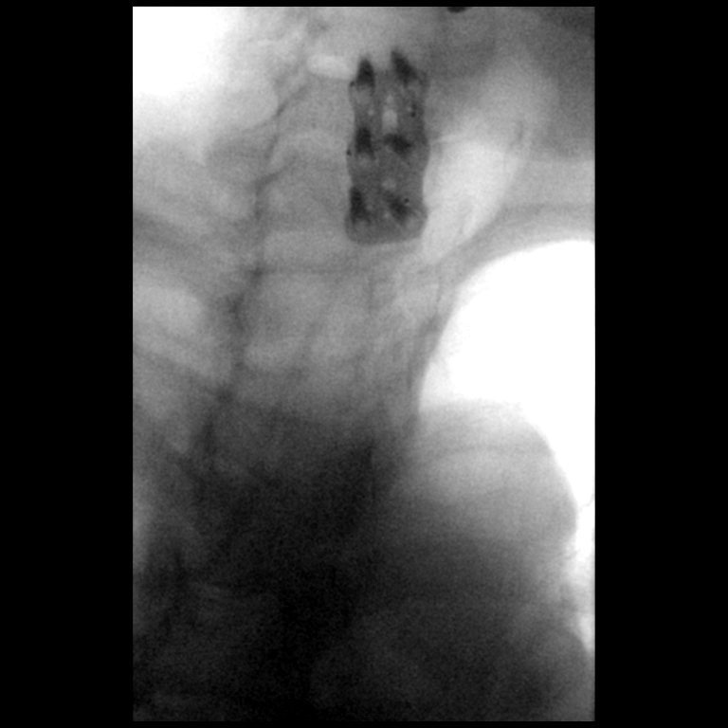

[Series 10: cp_standard · 0.17mm/px · 1 of 77 frames shown (10 of 11)]
[frame 12/77]
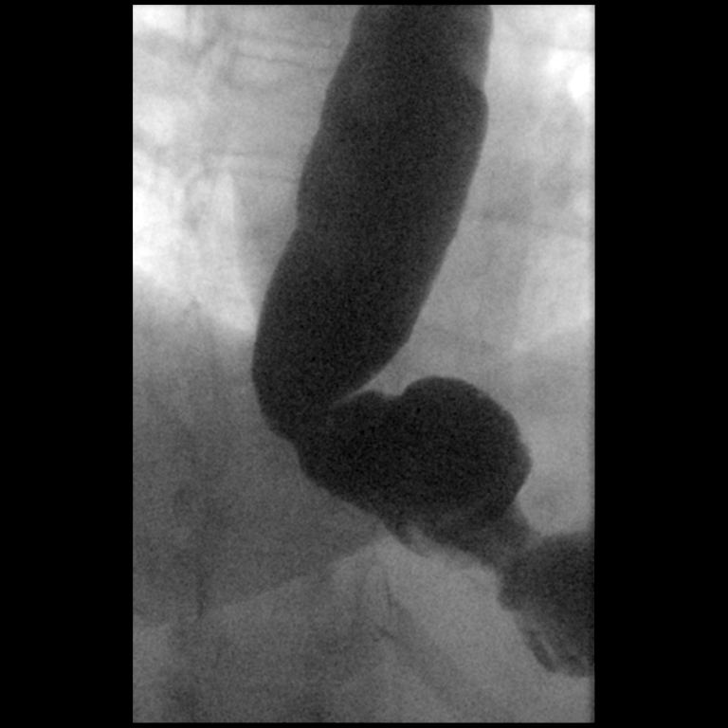

[Series 11: cp_standard · 0.18mm/px · 1 of 1 slices shown (11 of 11)]
[im 1/1]
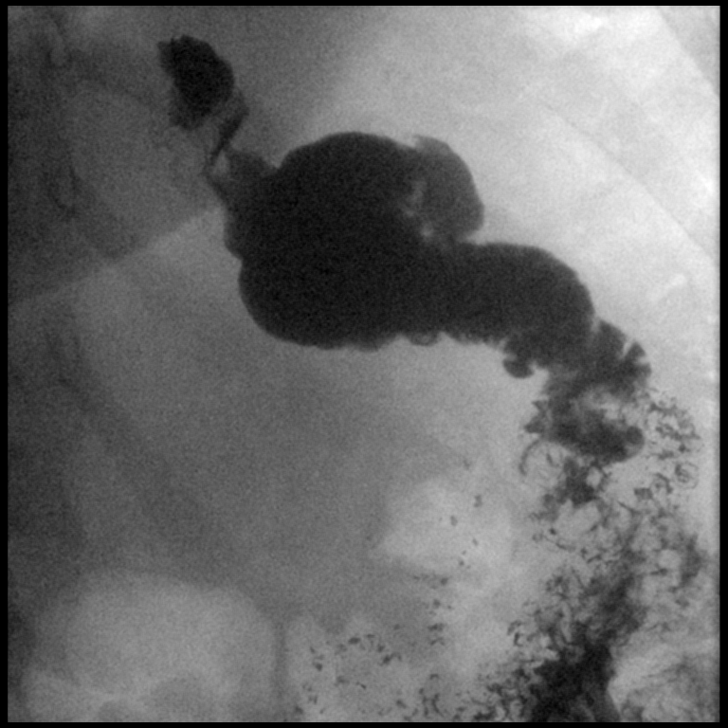

[12 of 24 positions shown; findings below may reference images not displayed]

FINDINGS: Esophageal distention: Minimal dilatation of distal esophagus.
Narrowing at the gastroesophageal junction suggestion of mild
beaking. This could represent a simple stricture or achalasia.

Filling defects:  None

12.5 mm barium tablet: Obstructed at focus of narrowing at the
gastroesophageal junction, dissolved in place

Motility:  Diffuse impairment of esophageal motility

Mucosa:  Smooth without irregularity or ulceration

Hypopharynx/cervical esophagus: No laryngeal penetration or
aspiration. No residuals.

Hiatal hernia:  Small sliding hiatal hernia seen

GE reflux:  Not witnessed during exam

Other: Prior gastric bypass with patent anastomosis. Prior cervical
fusion C5-C7.
IMPRESSION: Diffuse esophageal dysmotility.

Prior gastric bypass.

Small sliding hiatal hernia.

Narrowing at the GE junction with smooth mucosa and suggestion of
mild beaking, question simple stricture versus achalasia.

## 2022-08-18 ENCOUNTER — Other Ambulatory Visit: Payer: Self-pay | Admitting: Internal Medicine

## 2022-08-20 ENCOUNTER — Other Ambulatory Visit: Payer: Self-pay

## 2022-08-20 DIAGNOSIS — R1319 Other dysphagia: Secondary | ICD-10-CM

## 2022-08-20 DIAGNOSIS — K224 Dyskinesia of esophagus: Secondary | ICD-10-CM

## 2022-08-20 MED ORDER — DILTIAZEM HCL 60 MG PO TABS: 60.0000 mg | ORAL_TABLET | Freq: Four times a day (QID) | ORAL | 3 refills | Status: AC

## 2022-08-31 ENCOUNTER — Other Ambulatory Visit: Payer: Self-pay | Admitting: Internal Medicine
